# Patient Record
Sex: Female | Born: 1985 | Race: White | Hispanic: No | Marital: Married | Smoking: Never smoker
Health system: Southern US, Community
[De-identification: ages and names within clinical notes are randomized; demographics above are authoritative.]

## PROBLEM LIST (undated history)

## (undated) DIAGNOSIS — T8859XA Other complications of anesthesia, initial encounter: Secondary | ICD-10-CM

## (undated) DIAGNOSIS — F419 Anxiety disorder, unspecified: Secondary | ICD-10-CM

## (undated) DIAGNOSIS — T4145XA Adverse effect of unspecified anesthetic, initial encounter: Secondary | ICD-10-CM

## (undated) DIAGNOSIS — F32A Depression, unspecified: Secondary | ICD-10-CM

## (undated) DIAGNOSIS — F329 Major depressive disorder, single episode, unspecified: Secondary | ICD-10-CM

## (undated) DIAGNOSIS — Z789 Other specified health status: Secondary | ICD-10-CM

## (undated) HISTORY — PX: NO PAST SURGERIES: SHX2092

## (undated) HISTORY — PX: COMPARTMENT SYNDROME MEASUREMENT: CATH118296

---
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
## 2005-04-24 ENCOUNTER — Ambulatory Visit: Payer: Self-pay | Admitting: Orthopaedic Surgery

## 2013-03-13 ENCOUNTER — Emergency Department: Payer: Self-pay | Admitting: Emergency Medicine

## 2013-03-13 LAB — CBC
HGB: 13.6 g/dL (ref 12.0–16.0)
MCH: 30.1 pg (ref 26.0–34.0)
MCHC: 34.7 g/dL (ref 32.0–36.0)
MCV: 87 fL (ref 80–100)
Platelet: 171 10*3/uL (ref 150–440)
RBC: 4.51 10*6/uL (ref 3.80–5.20)
RDW: 12.1 % (ref 11.5–14.5)
WBC: 10.6 10*3/uL (ref 3.6–11.0)

## 2013-03-13 LAB — URINALYSIS, COMPLETE
Blood: NEGATIVE
Glucose,UR: NEGATIVE mg/dL (ref 0–75)
Ketone: NEGATIVE
Nitrite: NEGATIVE
Ph: 7 (ref 4.5–8.0)
Protein: NEGATIVE

## 2013-03-13 LAB — GC/CHLAMYDIA PROBE AMP

## 2013-03-13 LAB — COMPREHENSIVE METABOLIC PANEL
Albumin: 3.8 g/dL (ref 3.4–5.0)
Anion Gap: 4 — ABNORMAL LOW (ref 7–16)
Chloride: 108 mmol/L — ABNORMAL HIGH (ref 98–107)
Co2: 26 mmol/L (ref 21–32)
Creatinine: 1.14 mg/dL (ref 0.60–1.30)
EGFR (Non-African Amer.): >60
Glucose: 155 mg/dL — ABNORMAL HIGH (ref 65–99)
SGPT (ALT): 19 U/L (ref 12–78)
Total Protein: 7.4 g/dL (ref 6.4–8.2)

## 2013-03-13 LAB — HCG, QUANTITATIVE, PREGNANCY: Beta Hcg, Quant.: <1 m[IU]/mL — ABNORMAL LOW

## 2014-02-14 IMAGING — US US PELV - US TRANSVAGINAL
1 series · 14 of 25 positions shown · non-contrast
Comparison: none

REASON FOR EXAM: LOWER PELVIC PAIN ESP LLQ
COMMENTS:   May transport without cardiac monitor

[Series 1: us pelv - us transvaginal · 0.30mm/px · 14 of 52 slices shown]
[im 1/52]
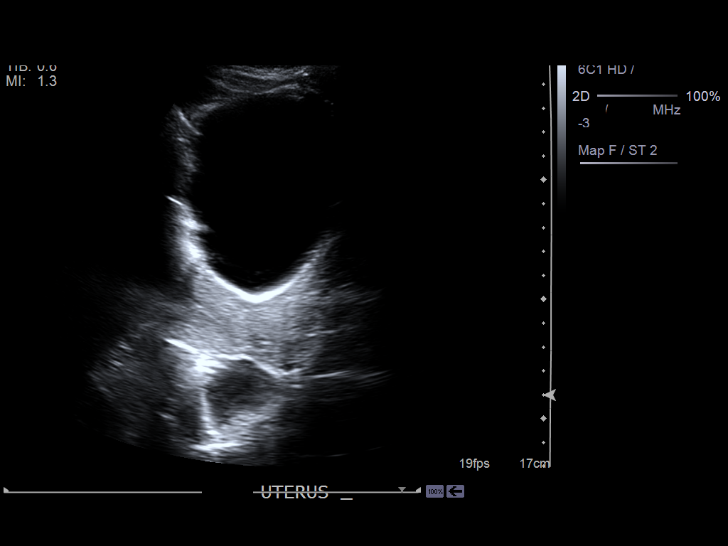
[im 5/52]
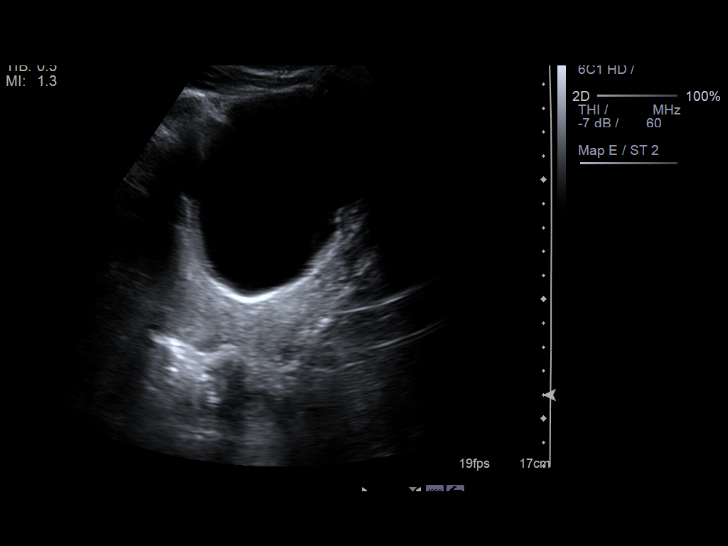
[im 9/52]
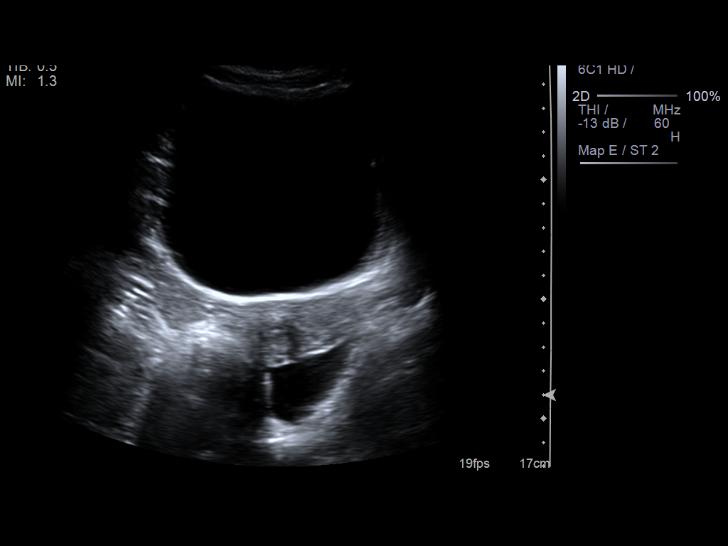
[im 13/52]
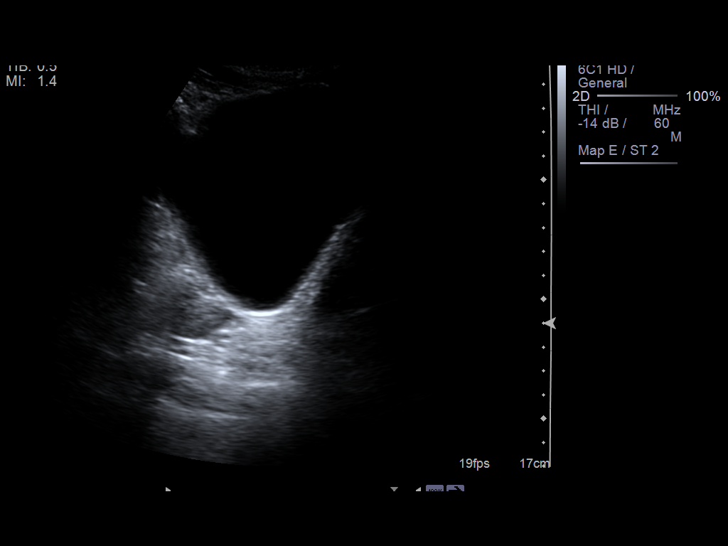
[im 18/52]
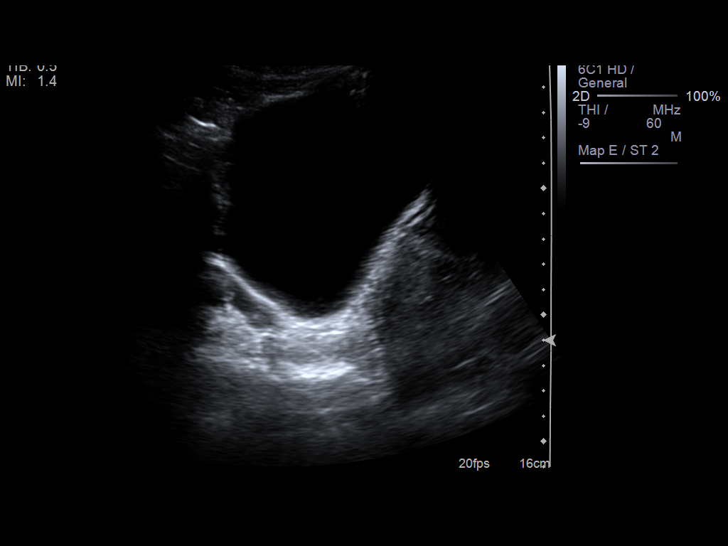
[im 20/52]
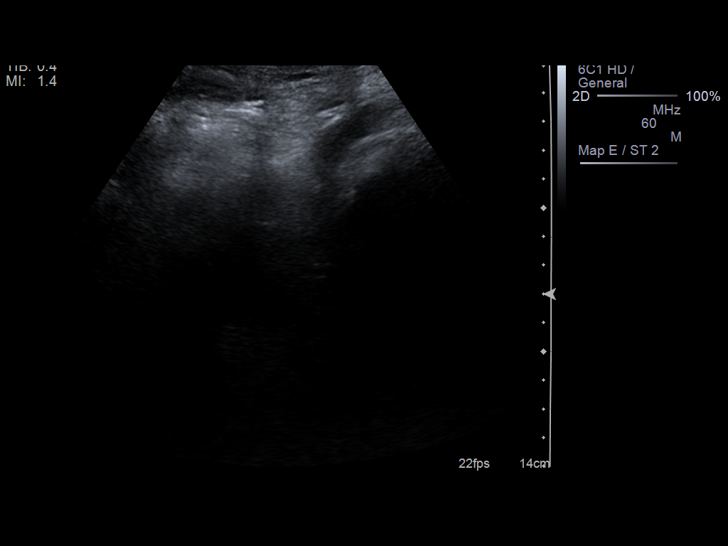
[im 24/52]
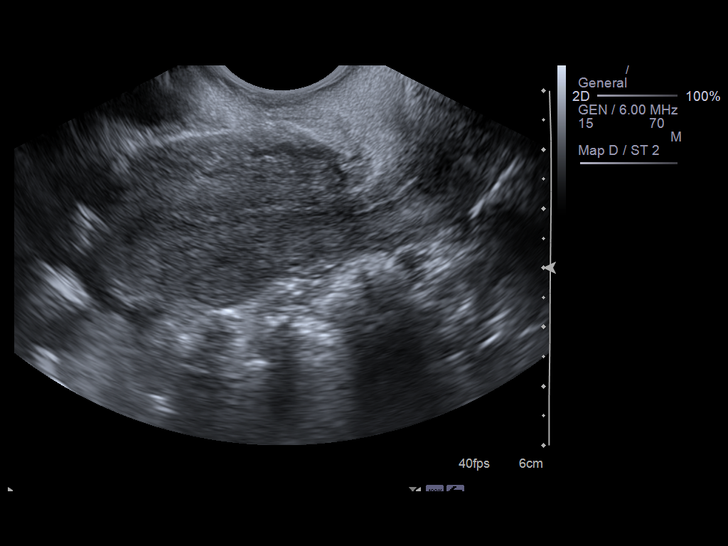
[im 28/52]
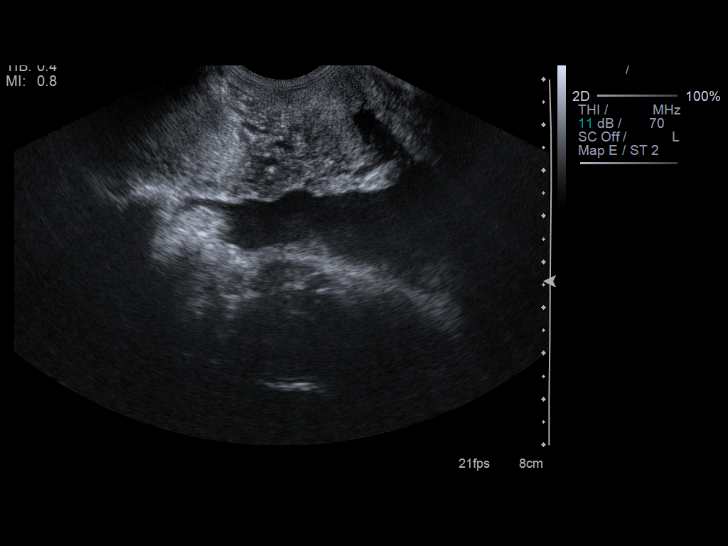
[im 32/52]
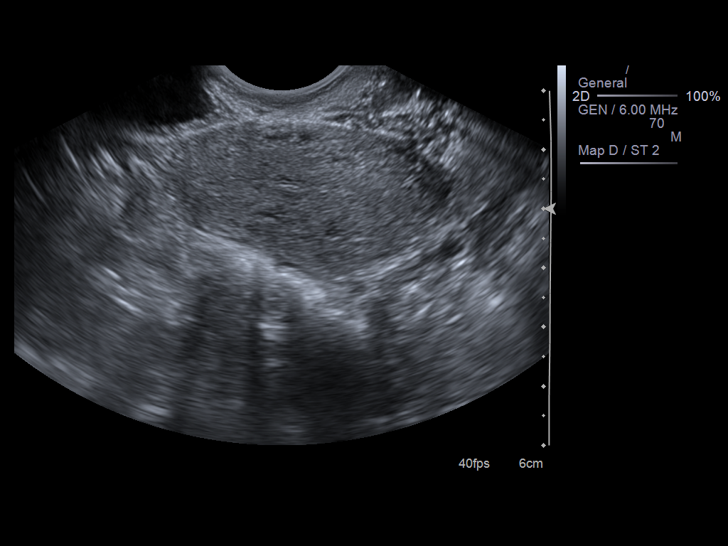
[im 35/52]
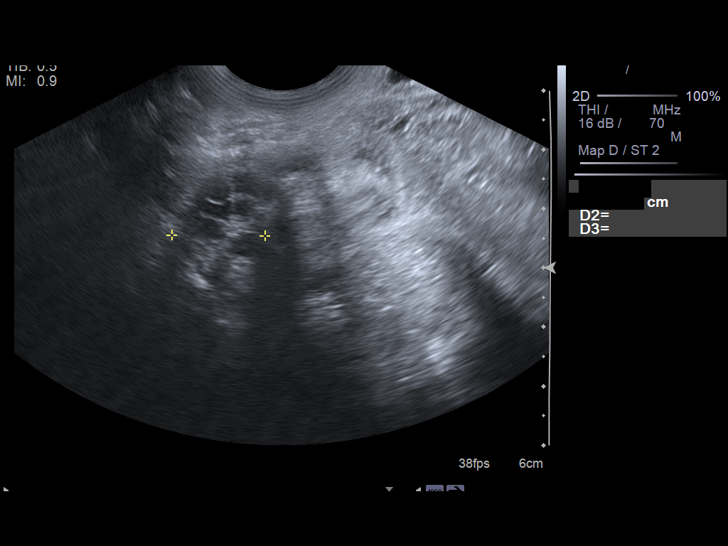
[im 39/52]
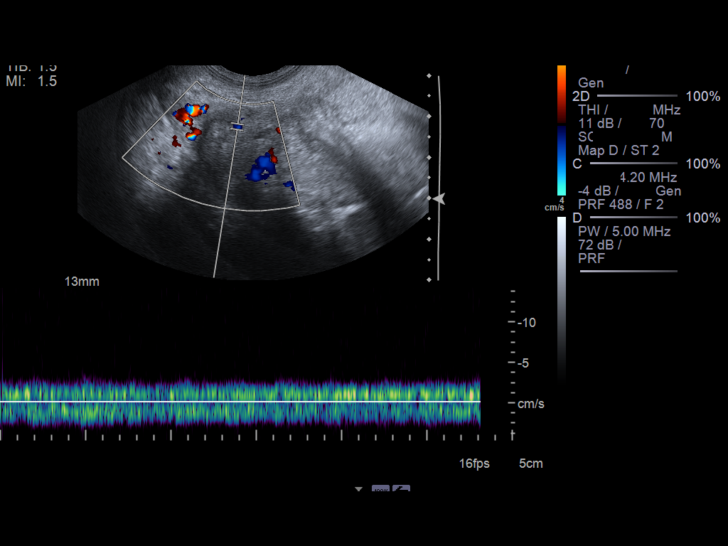
[im 43/52]
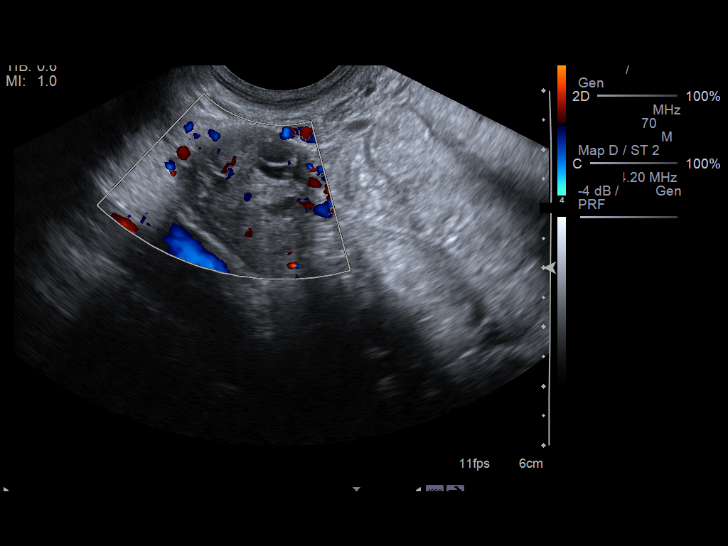
[im 47/52]
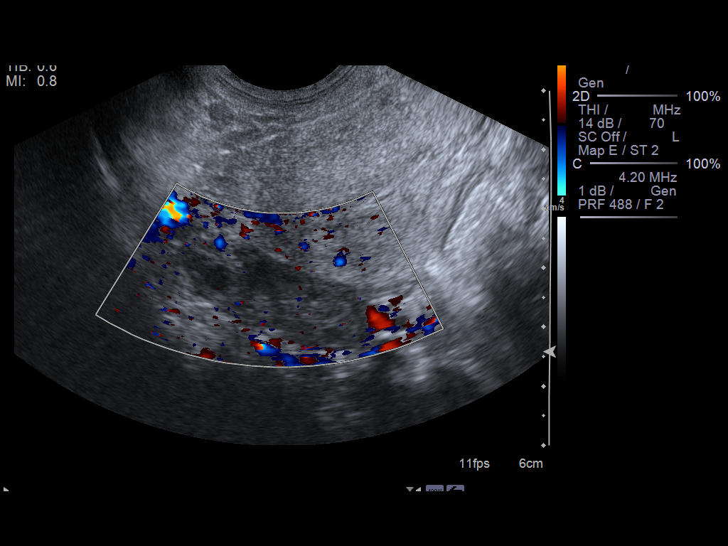
[im 52/52]
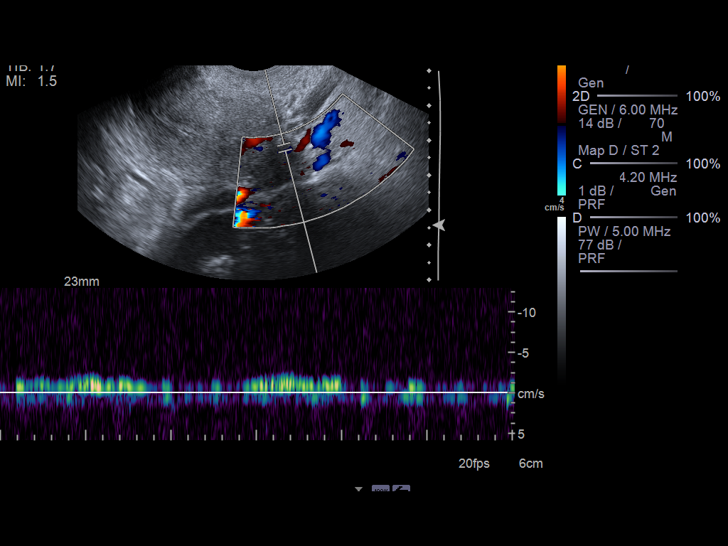

[14 of 25 positions shown; findings below may reference images not displayed]

PROCEDURE:     US  - US PELVIS EXAM W/TRANSVAGINAL  - March 13, 2013  [DATE]

RESULT:     Transabdominal and transvaginal imaging was performed through
the pelvis.

The uterus is normal in echotexture and contour and measures 6.1 x 4.3 x 3
cm. The endometrial stripe is normal at 2 mm. There is free fluid in the
cul-de-sac. The ovaries are normal in echotexture and contour and
vascularity. The right ovary measures 2.6 x 1.6 x 2.1 cm. The left ovary
measures 3.4 x 1.4 x 1.3 cm. Survey views of the kidneys exhibits no
hydronephrosis.
IMPRESSION: 1. The appearance of the uterus and ovaries is normal in appearance.
2. There is free fluid in the cul-de-sac.

A preliminary report was sent to the [HOSPITAL] the conclusion
of the study.

[REDACTED]

## 2014-02-14 IMAGING — CT CT ABD-PELV W/ CM
1 of 2 series · 14 of 32 positions shown, 18 images · non-contrast
Comparison: none

REASON FOR EXAM: (1) LOWER ABD PAIN; (2) LOWER ABD PAIN, EVAL
APPENDICITIS;    NOTE: Nursing to G
COMMENTS:   May transport without cardiac monitor

[Series 2: 3mm soft tissue · axial · 0.57mm/px · z∈[-1008,-610]mm · 14 of 153 slices shown, 18 images]
[im 13/153  soft-tissue]
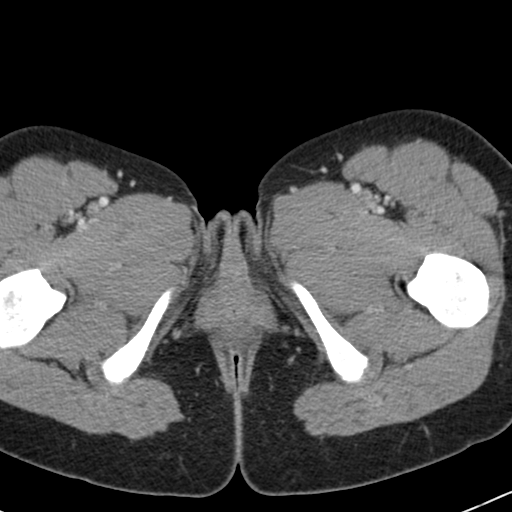
[im 13/153  bone]
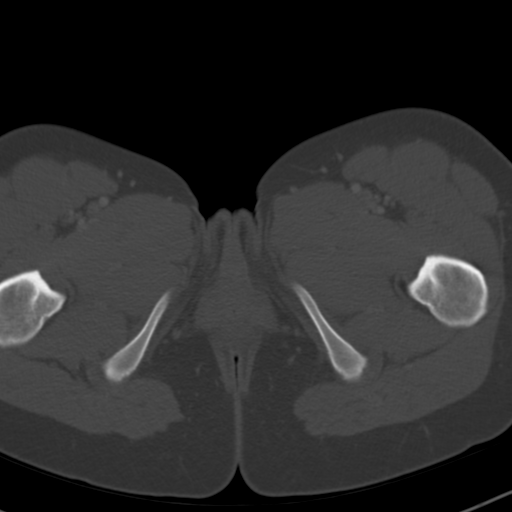
[im 25/153  soft-tissue]
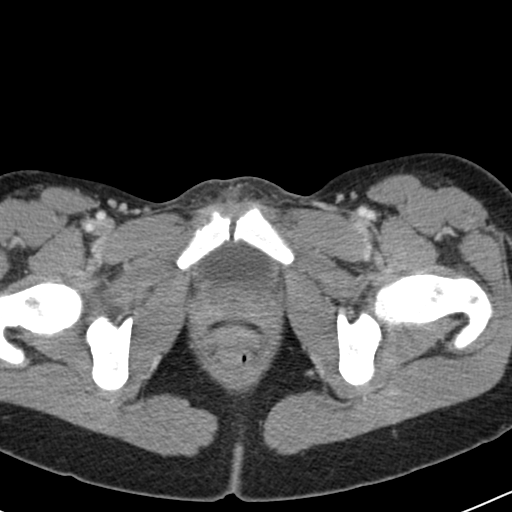
[im 37/153  soft-tissue]
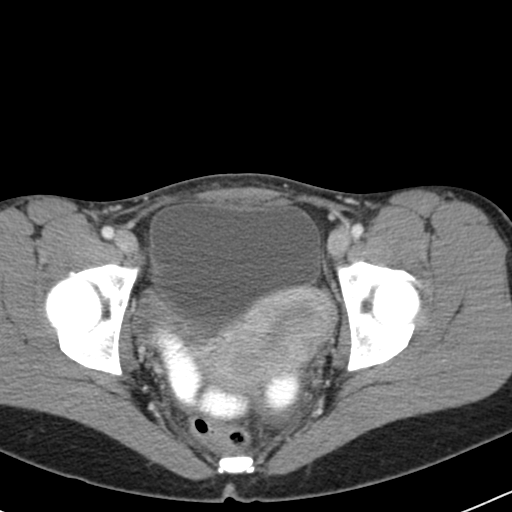
[im 49/153  soft-tissue]
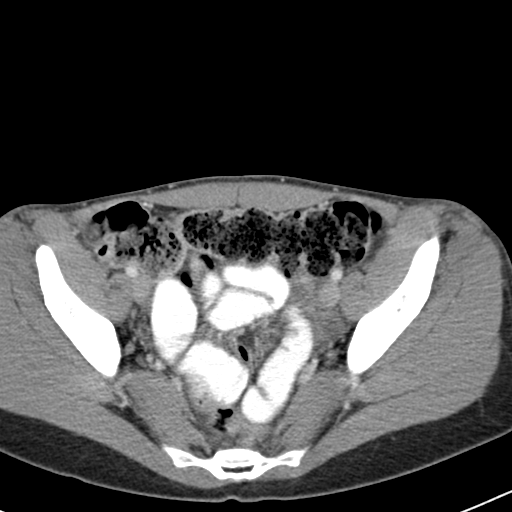
[im 61/153  soft-tissue]
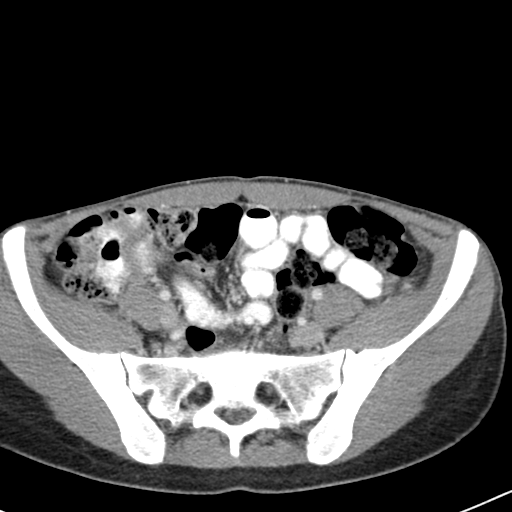
[im 73/153  soft-tissue]
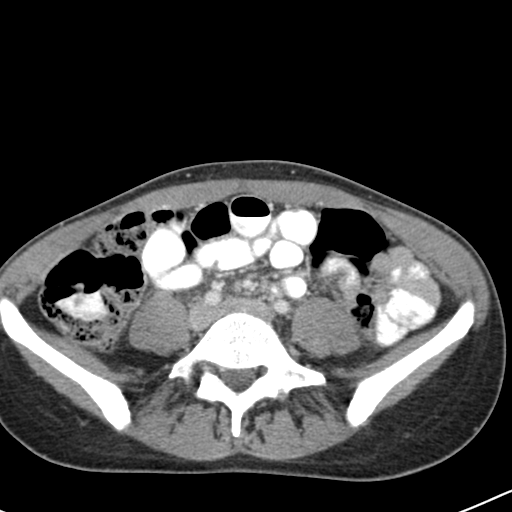
[im 86/153  soft-tissue]
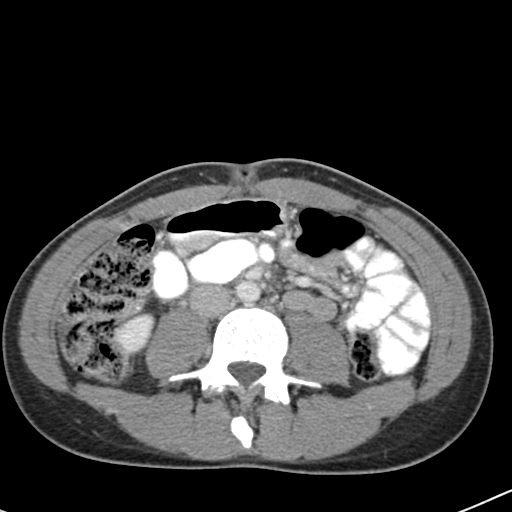
[im 98/153  soft-tissue]
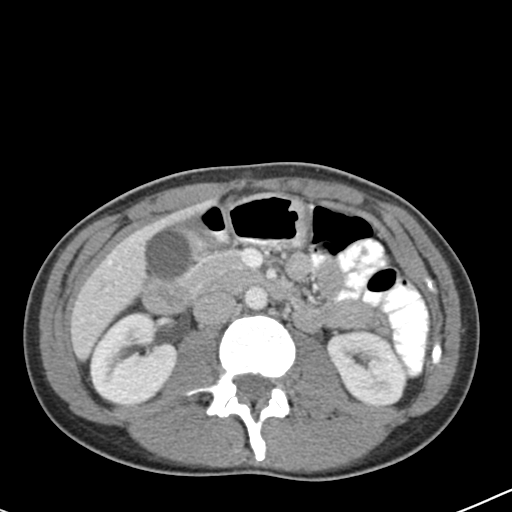
[im 110/153  soft-tissue]
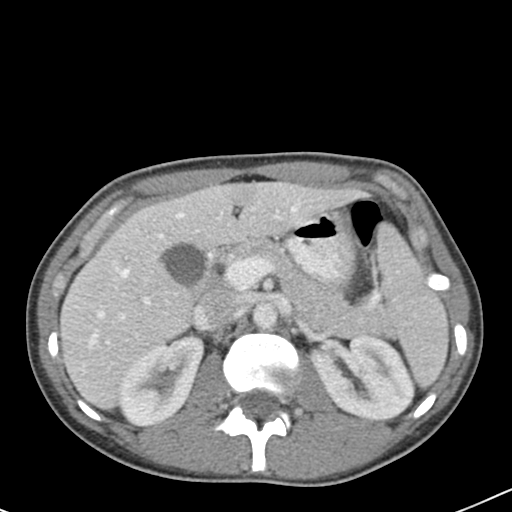
[im 110/153  bone]
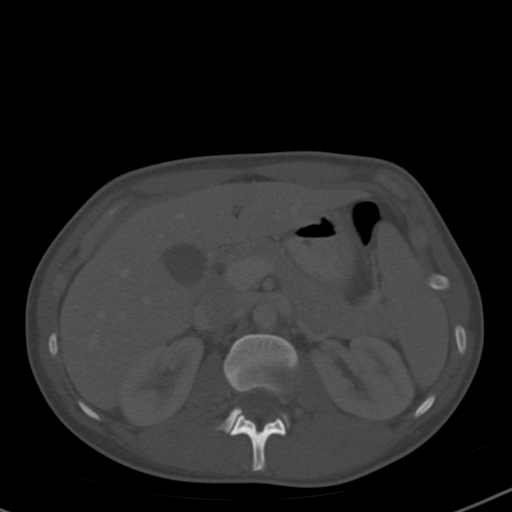
[im 122/153  soft-tissue]
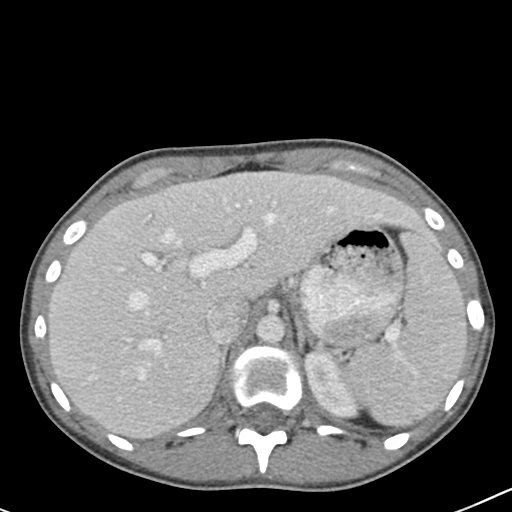
[im 128/153  lung]
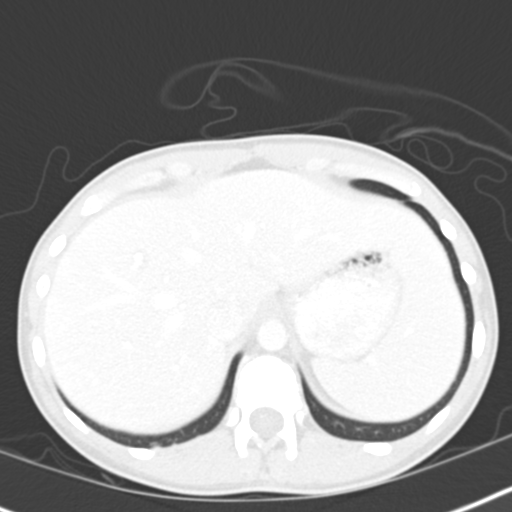
[im 134/153  soft-tissue]
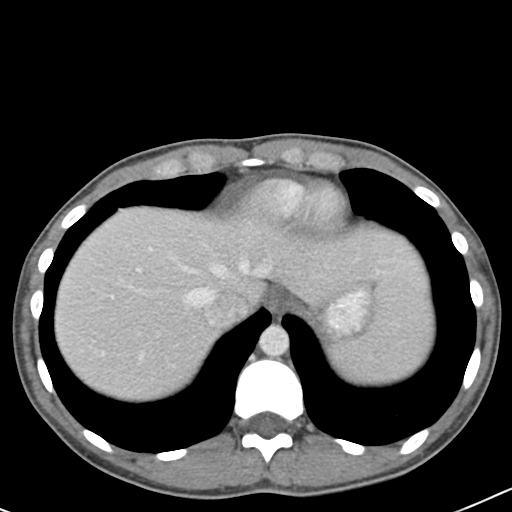
[im 134/153  lung]
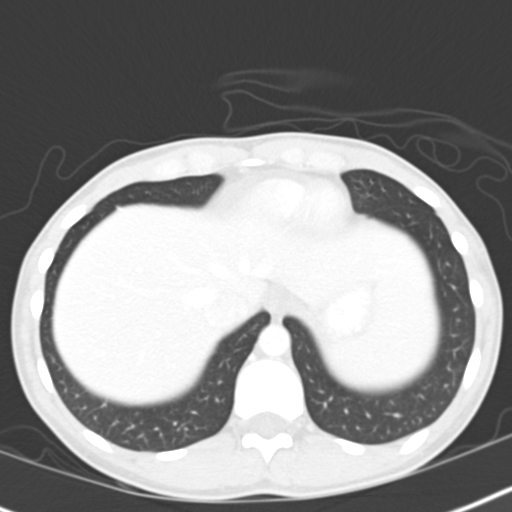
[im 140/153  lung]
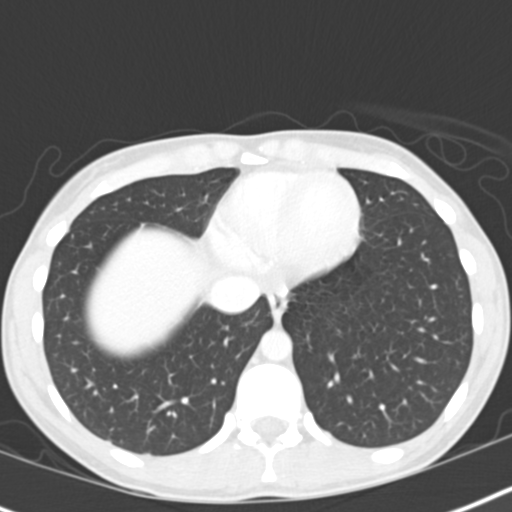
[im 146/153  soft-tissue]
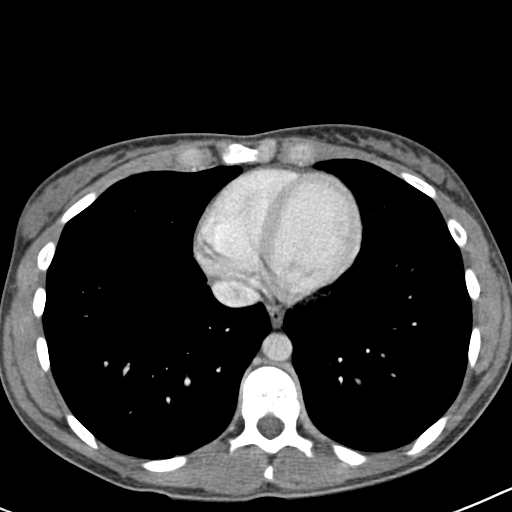
[im 146/153  lung]
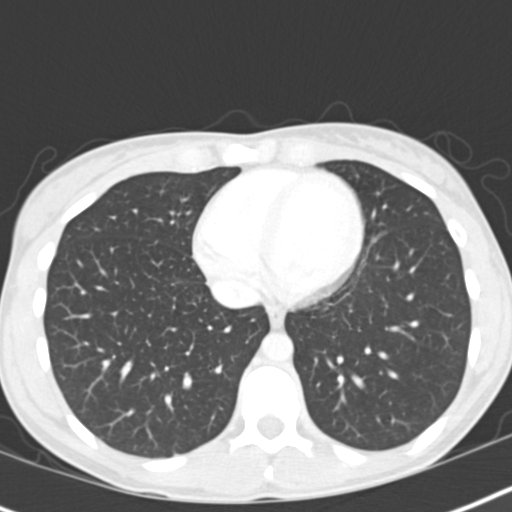

[14 of 32 positions shown; findings below may reference images not displayed]

PROCEDURE:     CT  - CT ABDOMEN / PELVIS  W  - March 13, 2013  [DATE]

RESULT:     Axial CT scanning was performed through the abdomen and pelvis
with reconstructions at 3 mm intervals and slice thicknesses. Review of
multiplanar reconstructed images was performed separately on the VIA
monitor. The patient received 100 cc of Rsovue-MEL intravenously and also
received oral contrast material.

The stomach is partially distended with contrast and gas. The small bowel
exhibits no evidence of obstruction. Contrast has just reached the right
colon. There is considerable stool present within the ascending and
transverse portions of the colon with somewhat less stool present in the
descending and rectosigmoid portions of the colon. There is no evidence of
colonic obstruction. There is no objective evidence of colitis or
diverticulitis. The features of the terminal ileum and of the cecum are
poorly separated from one another. There is no discrete abscess. The
appendix is not discretely identified. No free abdominal or pelvic fluid is
demonstrated.

The liver, gallbladder, pancreas, spleen , adrenal glands, and kidneys
exhibit no acute abnormalities. Within the pelvis the uterus is situated to
the left of midline and is grossly normal. The urinary bladder is partially
distended and normal in appearance. There is likely a diaphragm present in
the the vagina. No adnexal masses are demonstrated.

The lung bases exhibit no focal infiltrates or pleural effusions.
IMPRESSION: 1. There is no evidence of a small or large bowel obstruction. There is
considerable stool in the ascending and transverse portions of the colon
which may reflect constipation. There is no evidence of colitis or
diverticulitis. The appendix is not discretely identified but it may be
present anteriorly on images 92 through 99 and is visualized best on the
coronal reconstructed images. If the structure demonstrated here is the
appendix it is of normal caliber and is gas filled.
2. There is no free fluid in the abdomen or pelvis.
3. No acute hepatobiliary abnormality nor acute urinary tract abnormality is
demonstrated. The uterus is situated to the left of midline. There is likely
a diaphragm in place over the cervix.

It may be useful to observed the patient to see if the orally administered
contrast will stimulate colonic contraction and evacuation. Correlation with
patient's white blood cell count and other laboratory values will be needed
if the clinical findings do not suggest uncomplicated constipation.

[REDACTED]

## 2016-02-13 ENCOUNTER — Ambulatory Visit
Admission: EM | Admit: 2016-02-13 | Discharge: 2016-02-13 | Disposition: A | Discharge status: Home / Self Care | Payer: BC Managed Care – PPO | Attending: Family Medicine | Admitting: Family Medicine

## 2016-02-13 DIAGNOSIS — B029 Zoster without complications: Secondary | ICD-10-CM

## 2016-02-13 MED ORDER — VALACYCLOVIR HCL 1 G PO TABS: 1000.0000 mg | ORAL_TABLET | Freq: Three times a day (TID) | ORAL | 0 refills | Status: DC

## 2016-02-13 NOTE — ED Triage Notes (Addendum)
Patients presents with poison ivy on her face.

## 2016-03-30 NOTE — ED Provider Notes (Signed)
[facility]    CSN: [id number] Arrival date & time: 02/13/16  1525     History   Chief Complaint Chief Complaint  Patient presents with  . Poison Ivy    On face    HPI Alison Banks is a 30 y.o. female.   30 yo female with a c/o rash of blisters to forehead area that appeared suddenly. States feels burning pain with the rash. Denies any fevers, chills, contacts with plants, chemicals, pets.    The history is provided by the patient.  Poison Ivy     History reviewed. No pertinent past medical history.  There are no active problems to display for this patient.   History reviewed. No pertinent surgical history.  OB History    No data available       Home Medications    Prior to Admission medications   Medication Sig Start Date End Date Taking? Authorizing Provider  valACYclovir (VALTREX) 1000 MG tablet Take 1 tablet (1,000 mg total) by mouth 3 (three) times daily. 02/13/16   Payton Mccallumrlando Keymani Glynn, MD    Family History History reviewed. No pertinent family history.  Social History Social History  Substance Use Topics  . Smoking status: Never Smoker  . Smokeless tobacco: Never Used  . Alcohol use No     Allergies   Review of patient's allergies indicates no known allergies.   Review of Systems Review of Systems   Physical Exam Triage Vital Signs ED Triage Vitals  Enc Vitals Group     BP 02/13/16 1542 121/69     Pulse Rate 02/13/16 1542 72     Resp 02/13/16 1542 18     Temp 02/13/16 1542 98.2 F (36.8 C)     Temp Source 02/13/16 1542 Oral     SpO2 02/13/16 1542 100 %     Weight 02/13/16 1542 123 lb (55.8 kg)     Height 02/13/16 1542 5\' 5"  (1.651 m)     Head Circumference --      Peak Flow --      Pain Score 02/13/16 1540 0     Pain Loc --      Pain Edu? --      Excl. in GC? --    No data found.   Updated Vital Signs BP 121/69 (BP Location: Right Arm)   Pulse 72   Temp 98.2 F (36.8 C) (Oral)   Resp 18   Ht 5\' 5"  (1.651 m)    Wt 123 lb (55.8 kg)   LMP 01/30/2016   SpO2 100%   BMI 20.47 kg/m   Visual Acuity Right Eye Distance:   Left Eye Distance:   Bilateral Distance:    Right Eye Near:   Left Eye Near:    Bilateral Near:     Physical Exam  Constitutional: She appears well-developed and well-nourished. No distress.  Skin: Rash noted. Rash is vesicular. She is not diaphoretic. There is erythema.     Nursing note and vitals reviewed.    UC Treatments / Results  Labs (all labs ordered are listed, but only abnormal results are displayed) Labs Reviewed - No data to display  EKG  EKG Interpretation None       Radiology No results found.  Procedures Procedures (including critical care time)  Medications Ordered in UC Medications - No data to display   Initial Impression / Assessment and Plan / UC Course  I have reviewed the triage vital signs and the nursing notes.  Pertinent labs & imaging results that were available during my care of the patient were reviewed by me and considered in my medical decision making (see chart for details).  Clinical Course      Final Clinical Impressions(s) / UC Diagnoses   Final diagnoses:  Shingles    New Prescriptions Discharge Medication List as of 02/13/2016  4:04 PM    START taking these medications   Details  valACYclovir (VALTREX) 1000 MG tablet Take 1 tablet (1,000 mg total) by mouth 3 (three) times daily., Starting Wed 02/13/2016, Normal       1.diagnosis reviewed with patient 2. rx as per orders above; reviewed possible side effects, interactions, risks and benefits  3. Recommend supportive treatment with otc analgesics prn 4. Follow-up prn if symptoms worsen or don't improve   Payton Mccallum, MD 03/30/16 1818

## 2016-06-16 NOTE — L&D Delivery Note (Addendum)
Delivery Note At 3:11 AM a viable female was delivered via Vaginal, Spontaneous Delivery (Presentation: OA;  ).Pt progressed quickly and began to scream out. SROM occurred at [facility] for clear fluid with pt pushing due to complete and inability to control the urge to push. Nitrous Oxide being used to facilitate delivery. Vtx, ant and post shoulder and body followed completely and to mom's abd. No CAN. Delayed cord clamping done with CCx2 and cut per dad. Cord blood collected.   APGAR:8 ,9 ; weight  Placenta status: SDOP at 0319 intact, non-stained. 3 VC. , .  Cord:  with the following complications: None Cord pH: not done. Needlex x 2 and 10 sponges and 1 Lap pad correct. FF and lochia mod, VSS. Hemostasis achieved. 1% xylocaine for local to perineum. FF and massged to firm and EBL 100 ml's.   Anesthesia:  1%xylocaine Episiotomy: None Lacerations: 2nd degree Perineal Suture Repair: 3-0 CH on CT  Est. Blood Loss (mL): 100 ml's Mom to  Postpartum status.  Baby to skin to skin with mom.   Sharee Pimple 02/03/2017, 3:40 AM

## 2016-07-07 LAB — OB RESULTS CONSOLE HIV ANTIBODY (ROUTINE TESTING): HIV: NONREACTIVE

## 2016-07-07 LAB — OB RESULTS CONSOLE VARICELLA ZOSTER ANTIBODY, IGG: VARICELLA IGG: IMMUNE

## 2016-07-07 LAB — OB RESULTS CONSOLE RUBELLA ANTIBODY, IGM: RUBELLA: IMMUNE

## 2016-07-07 LAB — OB RESULTS CONSOLE HEPATITIS B SURFACE ANTIGEN: Hepatitis B Surface Ag: NEGATIVE

## 2016-07-07 LAB — OB RESULTS CONSOLE RPR: RPR: NONREACTIVE

## 2016-07-08 ENCOUNTER — Other Ambulatory Visit: Payer: Self-pay | Admitting: Obstetrics and Gynecology

## 2016-07-08 DIAGNOSIS — Z369 Encounter for antenatal screening, unspecified: Secondary | ICD-10-CM

## 2016-07-24 ENCOUNTER — Telehealth: Payer: Self-pay | Admitting: Obstetrics and Gynecology

## 2016-07-24 NOTE — Telephone Encounter (Signed)
Alison Banks is scheduled for first trimester screening at [facility] [place] on Monday, 07/28/2016.  We typically include genetic counseling prior to the screening test to ensure informed consent prior to testing.  I will be at an offsite meeting on the day of Alison Banks's appointment, so I called her to review the testing options prior to her visit.  At the time of her visit, a [facility] physician will be present and available to answer any additional questions.  I plan to follow up with her after the visit when results become available.  We discussed the following routine screening tests for this pregnancy:  First trimester screening, which includes nuchal translucency ultrasound screen and first trimester maternal serum marker screening.  The nuchal translucency has approximately an 80% detection rate for Down syndrome and can be positive for other chromosome abnormalities as well as congenital heart defects.  When combined with a maternal serum marker screening, the detection rate is up to 90% for Down syndrome and up to 97% for trisomy 18.     Maternal serum marker screening, a blood test that measures pregnancy proteins, can provide risk assessments for Down syndrome, trisomy 18, and open neural tube defects (spina bifida, anencephaly). Because it does not directly examine the fetus, it cannot positively diagnose or rule out these problems.  Targeted ultrasound uses high frequency sound waves to create an image of the developing fetus.  An ultrasound is often recommended as a routine means of evaluating the pregnancy.  It is also used to screen for fetal anatomy problems (for example, a heart defect) that might be suggestive of a chromosomal or other abnormality.   Should these screening tests indicate an increased concern, then the following additional testing options would be offered:  The chorionic villus sampling procedure is available for first trimester chromosome analysis.   This involves the withdrawal of a small amount of chorionic villi (tissue from the developing placenta).  Risk of pregnancy loss is estimated to be approximately 1 in 200 to 1 in 100 (0.5 to 1%).  There is approximately a 1% (1 in 100) chance that the CVS chromosome results will be unclear.  Chorionic villi cannot be tested for neural tube defects.     Amniocentesis involves the removal of a small amount of amniotic fluid from the sac surrounding the fetus with the use of a thin needle inserted through the maternal abdomen and uterus.  Ultrasound guidance is used throughout the procedure.  Fetal cells from amniotic fluid are directly evaluated and > 99.5% of chromosome problems and > 98% of open neural tube defects can be detected. This procedure is generally performed after the 15th week of pregnancy.  The main risks to this procedure include complications leading to miscarriage in less than 1 in 200 cases (0.5%).  As another option for information if the pregnancy is suspected to be an an increased chance for certain chromosome conditions, we also reviewed the availability of cell free fetal DNA testing from maternal blood to determine whether or not the baby may have either Down syndrome, trisomy 14, or trisomy 52.  This test utilizes a maternal blood sample and DNA sequencing technology to isolate circulating cell free fetal DNA from maternal plasma.  The fetal DNA can then be analyzed for DNA sequences that are derived from the three most common chromosomes involved in aneuploidy, chromosomes 13, 18, and 21.  If the overall amount of DNA is greater than the expected level for any  of these chromosomes, aneuploidy is suspected.  While we do not consider it a replacement for invasive testing and karyotype analysis, a negative result from this testing would be reassuring, though not a guarantee of a normal chromosome complement for the baby.  An abnormal result is certainly suggestive of an abnormal chromosome  complement, though we would still recommend CVS or amniocentesis to confirm any findings from this testing.  Cystic Fibrosis and Spinal Muscular Atrophy (SMA) screening were also discussed with the patient. Both conditions are recessive, which means that both parents must be carriers in order to have a child with the disease.  Cystic fibrosis (CF) is one of the most common genetic conditions in persons of Caucasian ancestry.  This condition occurs in approximately 1 in 2,500 Caucasian persons and results in thickened secretions in the lungs, digestive, and reproductive systems.  For a baby to be at risk for having CF, both of the parents must be carriers for this condition.  Approximately 1 in 425 Caucasian persons is a carrier for CF.  Current carrier testing looks for the most common mutations in the gene for CF and can detect approximately 90% of carriers in the Caucasian population.  This means that the carrier screening can greatly reduce, but cannot eliminate, the chance for an individual to have a child with CF.  If an individual is found to be a carrier for CF, then carrier testing would be available for the partner. As part of [place]'s newborn screening profile, all babies born in the state of [place] will have a two-tier screening process.  Specimens are first tested to determine the concentration of immunoreactive trypsinogen (IRT).  The top 5% of specimens with the highest IRT values then undergo DNA testing using a panel of over 40 common CF mutations. SMA is a neurodegenerative disorder that leads to atrophy of skeletal muscle and overall weakness.  This condition is also more prevalent in the Caucasian population, with 1 in 40-1 in 60 persons being a carrier and 1 in 6,000-1 in 10,000 children being affected.  There are multiple forms of the disease, with some causing death in infancy to other forms with survival into adulthood.  The genetics of SMA is complex, but carrier screening  can detect up to 95% of carriers in the Caucasian population.  Similar to CF, a negative result can greatly reduce, but cannot eliminate, the chance to have a child with SMA.  We inquired about the family history and pregnancy history.  Alison Banks reported that the father of the baby had a paternal aunt who passed away in early childhood (in the 311950s) from complications of spina bifida.  There is also a paternal second cousin to the pregnancy with spina bifida.  We reviewed multifactorial inheritance of open neural tube defects when they are present in the absence of other birth defects or genetic syndromes.  The recurrence when a second degree relative or more distant relative is affected is estimated to be less than 1%, though it may be slightly higher in this family given the presence of two affected family members.  We reviewed the option of screening for open neural tube defects with msAFP testing in the second trimester and detailed anatomy ultrasound after [redacted] weeks gestation. The remainder of the family history was reported to be unremarkable for birth defects, mental retardation, recurrent pregnancy loss or known chromosome abnormalities.  Alison Banks reported no complications or exposures in this pregnancy that would be expected to increase  the risk for birth defects.  Alison Banks declined CF and SMA carrier screening, but would like to proceed with first trimester screening at her visit on 07/28/2016.  Alison Banks was encouraged to call with questions or concerns.  We can be contacted at [phone].  Cherly Anderson, MS, CGC

## 2016-07-28 ENCOUNTER — Ambulatory Visit: Admission: RE | Admit: 2016-07-28 | Payer: BC Managed Care – PPO | Source: Ambulatory Visit

## 2016-07-28 ENCOUNTER — Ambulatory Visit
Admission: RE | Admit: 2016-07-28 | Discharge: 2016-07-28 | Disposition: A | Discharge status: Home / Self Care | Payer: BC Managed Care – PPO | Source: Ambulatory Visit | Attending: Maternal & Fetal Medicine | Admitting: Maternal & Fetal Medicine

## 2016-07-28 DIAGNOSIS — Z3401 Encounter for supervision of normal first pregnancy, first trimester: Secondary | ICD-10-CM | POA: Insufficient documentation

## 2016-07-28 DIAGNOSIS — Z3400 Encounter for supervision of normal first pregnancy, unspecified trimester: Secondary | ICD-10-CM | POA: Diagnosis present

## 2016-07-28 DIAGNOSIS — Z369 Encounter for antenatal screening, unspecified: Secondary | ICD-10-CM

## 2016-07-28 DIAGNOSIS — Z3A12 12 weeks gestation of pregnancy: Secondary | ICD-10-CM | POA: Insufficient documentation

## 2016-07-28 HISTORY — DX: Other specified health status: Z78.9

## 2016-08-04 ENCOUNTER — Telehealth: Payer: Self-pay | Admitting: Obstetrics and Gynecology

## 2016-08-04 ENCOUNTER — Telehealth: Payer: Self-pay

## 2016-08-04 NOTE — Telephone Encounter (Signed)
  Ms. Alison Banks elected to undergo First Trimester screening as a part of her routine prenatal care on 07/28/2016.  To review, first trimester screening, includes nuchal translucency ultrasound screen and/or first trimester maternal serum marker screening.  The nuchal translucency has approximately an 80% detection rate for Down syndrome and can be positive for other chromosome abnormalities as well as heart defects.  When combined with a maternal serum marker screening, the detection rate is up to 90% for Down syndrome and up to 97% for trisomy 13 and 18.     The results of the First Trimester Nuchal Translucency and Biochemical Screening were within normal range for aneuploidy.  The risk for Down syndrome is now estimated to be 1 in 3,092.  The risk for Trisomy 13/18 is 1 in 2,673.  Should more definitive information be desired, we would offer amniocentesis.  Because we do not yet know the effectiveness of combined first and second trimester screening, we do not recommend a maternal serum screen to assess the chance for chromosome conditions.  However, if screening for neural tube defects is desired, maternal serum screening for AFP only can be performed between 15 and [redacted] weeks gestation.    Of note, the PAPP-A level was at the 2nd percentile.  PAPP-A results at or below the 5th percentile have been associated with and increased chance for adverse obstetrical outcomes, including preeclampsia.  For this reason, a third trimester ultrasound for growth is recommended.  We may be reached at [phone] with any questions or to schedule the follow up ultrasound.  Cherly Andersoneborah F. Gloristine Turrubiates, MS, CGC

## 2017-01-14 LAB — OB RESULTS CONSOLE GBS: GBS: POSITIVE

## 2017-01-14 LAB — OB RESULTS CONSOLE GC/CHLAMYDIA
Chlamydia: NEGATIVE
Gonorrhea: NEGATIVE

## 2017-01-23 ENCOUNTER — Other Ambulatory Visit: Payer: Self-pay | Admitting: Obstetrics and Gynecology

## 2017-02-02 ENCOUNTER — Inpatient Hospital Stay
Admission: EM | Admit: 2017-02-02 | Discharge: 2017-02-05 | DRG: 775 | Disposition: A | Discharge status: Home / Self Care | Payer: BC Managed Care – PPO | Attending: Obstetrics and Gynecology | Admitting: Obstetrics and Gynecology

## 2017-02-02 DIAGNOSIS — O9902 Anemia complicating childbirth: Secondary | ICD-10-CM | POA: Diagnosis present

## 2017-02-02 DIAGNOSIS — O351XX0 Maternal care for (suspected) chromosomal abnormality in fetus, not applicable or unspecified: Secondary | ICD-10-CM | POA: Diagnosis present

## 2017-02-02 DIAGNOSIS — Z3A39 39 weeks gestation of pregnancy: Secondary | ICD-10-CM

## 2017-02-02 DIAGNOSIS — D649 Anemia, unspecified: Secondary | ICD-10-CM | POA: Diagnosis present

## 2017-02-02 DIAGNOSIS — Z3490 Encounter for supervision of normal pregnancy, unspecified, unspecified trimester: Secondary | ICD-10-CM | POA: Diagnosis present

## 2017-02-02 LAB — CBC
HEMATOCRIT: 36.6 % (ref 35.0–47.0)
HEMOGLOBIN: 12.7 g/dL (ref 12.0–16.0)
MCH: 30.5 pg (ref 26.0–34.0)
MCHC: 34.8 g/dL (ref 32.0–36.0)
MCV: 87.5 fL (ref 80.0–100.0)
Platelets: 187 10*3/uL (ref 150–440)
RBC: 4.18 MIL/uL (ref 3.80–5.20)
RDW: 14 % (ref 11.5–14.5)
WBC: 11.4 10*3/uL — ABNORMAL HIGH (ref 3.6–11.0)

## 2017-02-02 LAB — TYPE AND SCREEN
ABO/RH(D): B POS
Antibody Screen: NEGATIVE

## 2017-02-02 MED ORDER — ACETAMINOPHEN 325 MG PO TABS: 650.0000 mg | ORAL_TABLET | ORAL | Status: DC | PRN

## 2017-02-02 MED ORDER — LACTATED RINGERS IV SOLN
INTRAVENOUS | Status: DC
  Administered 2017-02-02: 21:00:00 via INTRAVENOUS

## 2017-02-02 MED ORDER — OXYCODONE-ACETAMINOPHEN 5-325 MG PO TABS: 2.0000 | ORAL_TABLET | ORAL | Status: DC | PRN

## 2017-02-02 MED ORDER — MISOPROSTOL 200 MCG PO TABS
ORAL_TABLET | ORAL | Status: AC
  Administered 2017-02-02: 25 ug via VAGINAL
  Filled 2017-02-02: qty 4

## 2017-02-02 MED ORDER — OXYTOCIN 10 UNIT/ML IJ SOLN
INTRAMUSCULAR | Status: AC
  Filled 2017-02-02: qty 2

## 2017-02-02 MED ORDER — LIDOCAINE HCL (PF) 1 % IJ SOLN
30.0000 mL | INTRAMUSCULAR | Status: DC | PRN
  Administered 2017-02-03: 30 mL via SUBCUTANEOUS

## 2017-02-02 MED ORDER — PENICILLIN G POT IN DEXTROSE 60000 UNIT/ML IV SOLN
3.0000 10*6.[IU] | INTRAVENOUS | Status: DC
  Filled 2017-02-02 (×4): qty 50

## 2017-02-02 MED ORDER — LACTATED RINGERS IV SOLN: 500.0000 mL | INTRAVENOUS | Status: DC | PRN

## 2017-02-02 MED ORDER — OXYTOCIN BOLUS FROM INFUSION
500.0000 mL | Freq: Once | INTRAVENOUS | Status: AC
  Administered 2017-02-03: 500 mL via INTRAVENOUS

## 2017-02-02 MED ORDER — AMMONIA AROMATIC IN INHA
RESPIRATORY_TRACT | Status: AC
  Filled 2017-02-02: qty 10

## 2017-02-02 MED ORDER — LIDOCAINE HCL (PF) 1 % IJ SOLN
INTRAMUSCULAR | Status: AC
  Administered 2017-02-03: 30 mL via SUBCUTANEOUS
  Filled 2017-02-02: qty 30

## 2017-02-02 MED ORDER — BUTORPHANOL TARTRATE 1 MG/ML IJ SOLN
1.0000 mg | INTRAMUSCULAR | Status: DC | PRN
  Administered 2017-02-03: 1 mg via INTRAVENOUS
  Filled 2017-02-02: qty 1

## 2017-02-02 MED ORDER — SOD CITRATE-CITRIC ACID 500-334 MG/5ML PO SOLN: 30.0000 mL | ORAL | Status: DC | PRN

## 2017-02-02 MED ORDER — MISOPROSTOL 25 MCG QUARTER TABLET
25.0000 ug | ORAL_TABLET | ORAL | Status: DC
  Administered 2017-02-02: 25 ug via VAGINAL
  Filled 2017-02-02: qty 1

## 2017-02-02 MED ORDER — OXYTOCIN 40 UNITS IN LACTATED RINGERS INFUSION - SIMPLE MED
2.5000 [IU]/h | INTRAVENOUS | Status: DC
  Filled 2017-02-02: qty 1000

## 2017-02-02 MED ORDER — DEXTROSE 5 % IV SOLN
5.0000 10*6.[IU] | Freq: Once | INTRAVENOUS | Status: AC
  Administered 2017-02-03: 5 10*6.[IU] via INTRAVENOUS
  Filled 2017-02-02: qty 5

## 2017-02-02 MED ORDER — OXYCODONE-ACETAMINOPHEN 5-325 MG PO TABS: 1.0000 | ORAL_TABLET | ORAL | Status: DC | PRN

## 2017-02-02 MED ORDER — ONDANSETRON HCL 4 MG/2ML IJ SOLN: 4.0000 mg | Freq: Four times a day (QID) | INTRAMUSCULAR | Status: DC | PRN

## 2017-02-02 NOTE — H&P (Signed)
Santosha Burness is a 31 y.o. female presenting for IOL for low Papp-A. PNC at [facility] significant for high risk for IUGR, PIH & and stillbirth. IOL scheduled for these reasons. AFI borderline low. Pt also developed anemia tx with Fe. Plan to induce at 39 weeks.  OB History    Gravida Para Term Preterm AB Living   1         0   SAB TAB Ectopic Multiple Live Births                 Past Medical History:  Diagnosis Date  . Medical history non-contributory    History reviewed. No pertinent surgical history. Family History: family history is not on file. Social History:  reports that she has never smoked. She has never used smokeless tobacco. She reports that she does not drink alcohol or use drugs.     Maternal Diabetes: 45 Genetic Screening: AFP neg Maternal Ultrasounds/Referrals:01/26/17 AFI 11.98 cm @40 % Fetal Ultrasounds or other Referrals:[facility] anatomy  Maternal Substance Abuse: None Significant Maternal Medications:  PNV, Fe Significant Maternal Lab Results: Low PAPP-A Other Comments:    Review of Systems  Constitutional: Negative.   HENT: Negative.   Eyes: Negative.   Respiratory: Negative.   Cardiovascular: Negative.   Gastrointestinal: Negative.   Genitourinary: Negative.   Musculoskeletal: Negative.   Skin: Negative.   Neurological: Negative.   Endo/Heme/Allergies: Negative.   Psychiatric/Behavioral: Negative.    History Dilation: 1 Effacement (%): 50 Station: -2 Exam by:: ALogan Bores, RN Blood pressure 131/89, pulse 86, temperature 97.7 F (36.5 C), temperature source Oral, resp. rate 18, height 5\' 5"  (1.651 m), weight 70.8 kg (156 lb), last menstrual period 05/05/2015. Exam Physical Exam  Prenatal labs: ABO, Rh: B pos Antibody: Neg  Rubella: Immune (01/22 0000) RPR: Nonreactive (01/22 0000)  HBsAg: Negative (01/22 0000)  HIV: Non-reactive (01/22 0000)  GBS: Positive (08/01 0000)  Varicella:IMMUNE Assessment/Plan: A:1. IUP at 39 weeks 2. High risk due to low  Papp-a putting pt at risk for PIH, stillborn and IUGR P: Induction scheduled by Dr Karleen Hampshire and pt aware and agrees with the plan of care. 2 Cytoec 25 mcg per vagina as ordered. 3. Continue to monitor UC/FHT's.    Sharee Pimple 02/02/2017, 9:29 PM

## 2017-02-03 LAB — CBC
HEMATOCRIT: 37.6 % (ref 35.0–47.0)
HEMOGLOBIN: 13.1 g/dL (ref 12.0–16.0)
MCH: 30.8 pg (ref 26.0–34.0)
MCHC: 34.8 g/dL (ref 32.0–36.0)
MCV: 88.5 fL (ref 80.0–100.0)
Platelets: 188 10*3/uL (ref 150–440)
RBC: 4.25 MIL/uL (ref 3.80–5.20)
RDW: 14 % (ref 11.5–14.5)
WBC: 22.6 10*3/uL — ABNORMAL HIGH (ref 3.6–11.0)

## 2017-02-03 MED ORDER — SIMETHICONE 80 MG PO CHEW: 80.0000 mg | CHEWABLE_TABLET | ORAL | Status: DC | PRN

## 2017-02-03 MED ORDER — DIBUCAINE 1 % RE OINT: 1.0000 "application " | TOPICAL_OINTMENT | RECTAL | Status: DC | PRN

## 2017-02-03 MED ORDER — ZOLPIDEM TARTRATE 5 MG PO TABS: 5.0000 mg | ORAL_TABLET | Freq: Once | ORAL | Status: DC

## 2017-02-03 MED ORDER — SENNOSIDES-DOCUSATE SODIUM 8.6-50 MG PO TABS
2.0000 | ORAL_TABLET | ORAL | Status: DC
  Administered 2017-02-04 – 2017-02-05 (×2): 2 via ORAL
  Filled 2017-02-03 (×2): qty 2

## 2017-02-03 MED ORDER — TETANUS-DIPHTH-ACELL PERTUSSIS 5-2.5-18.5 LF-MCG/0.5 IM SUSP: 0.5000 mL | Freq: Once | INTRAMUSCULAR | Status: DC

## 2017-02-03 MED ORDER — COCONUT OIL OIL: 1.0000 "application " | TOPICAL_OIL | Status: DC | PRN

## 2017-02-03 MED ORDER — PRENATAL MULTIVITAMIN CH
1.0000 | ORAL_TABLET | Freq: Every day | ORAL | Status: DC
  Administered 2017-02-03 – 2017-02-05 (×3): 1 via ORAL
  Filled 2017-02-03 (×3): qty 1

## 2017-02-03 MED ORDER — ZOLPIDEM TARTRATE 5 MG PO TABS: 5.0000 mg | ORAL_TABLET | Freq: Every evening | ORAL | Status: DC | PRN

## 2017-02-03 MED ORDER — ACETAMINOPHEN 325 MG PO TABS
650.0000 mg | ORAL_TABLET | ORAL | Status: DC | PRN
Start: 2017-02-03 — End: 2017-02-05

## 2017-02-03 MED ORDER — ONDANSETRON HCL 4 MG PO TABS: 4.0000 mg | ORAL_TABLET | ORAL | Status: DC | PRN

## 2017-02-03 MED ORDER — DIPHENHYDRAMINE HCL 25 MG PO CAPS: 25.0000 mg | ORAL_CAPSULE | Freq: Four times a day (QID) | ORAL | Status: DC | PRN

## 2017-02-03 MED ORDER — SODIUM CHLORIDE 0.9% FLUSH: 3.0000 mL | INTRAVENOUS | Status: DC | PRN

## 2017-02-03 MED ORDER — WITCH HAZEL-GLYCERIN EX PADS: 1.0000 "application " | MEDICATED_PAD | CUTANEOUS | Status: DC | PRN

## 2017-02-03 MED ORDER — MEASLES, MUMPS & RUBELLA VAC ~~LOC~~ INJ: 0.5000 mL | INJECTION | Freq: Once | SUBCUTANEOUS | Status: DC

## 2017-02-03 MED ORDER — SODIUM CHLORIDE 0.9% FLUSH: 3.0000 mL | Freq: Two times a day (BID) | INTRAVENOUS | Status: DC

## 2017-02-03 MED ORDER — ONDANSETRON HCL 4 MG/2ML IJ SOLN: 4.0000 mg | INTRAMUSCULAR | Status: DC | PRN

## 2017-02-03 MED ORDER — IBUPROFEN 600 MG PO TABS
600.0000 mg | ORAL_TABLET | Freq: Four times a day (QID) | ORAL | Status: DC
  Administered 2017-02-03 – 2017-02-05 (×10): 600 mg via ORAL
  Filled 2017-02-03 (×9): qty 1

## 2017-02-03 MED ORDER — BENZOCAINE-MENTHOL 20-0.5 % EX AERO: 1.0000 "application " | INHALATION_SPRAY | CUTANEOUS | Status: DC | PRN

## 2017-02-03 MED ORDER — FLEET ENEMA 7-19 GM/118ML RE ENEM: 1.0000 | ENEMA | Freq: Every day | RECTAL | Status: DC | PRN

## 2017-02-03 MED ORDER — BISACODYL 10 MG RE SUPP: 10.0000 mg | Freq: Every day | RECTAL | Status: DC | PRN

## 2017-02-03 MED ORDER — IBUPROFEN 600 MG PO TABS
ORAL_TABLET | ORAL | Status: AC
  Administered 2017-02-03: 600 mg via ORAL
  Filled 2017-02-03: qty 1

## 2017-02-03 MED ORDER — SODIUM CHLORIDE 0.9 % IV SOLN: 250.0000 mL | INTRAVENOUS | Status: DC | PRN

## 2017-02-03 NOTE — Discharge Instructions (Signed)
Care of a Perineal Tear °A perineal tear is a cut (laceration) in the tissue between the opening of the vagina and the anus (perineum). Some women naturally develop a perineal tear during a vaginal birth. This can happen as the baby emerges from the birth canal and the perineum is stretched. Perineal tears are graded based on how deep and long the laceration is. The grading for perineal tears is as follows: °· First degree. This involves a shallow tear at the edge of the vaginal opening that extends slightly into the perineal skin. °· Second degree. This involves tearing described in a first degree perineal tear and also a deeper tear of the vaginal opening and perineal tissues. It may also include tearing of a muscle just under the perineal skin. °· Third degree. This involves tearing described in a first and second degree perineal tear, with the tear extending into the muscle of the anus (anal sphincter). °· Fourth degree. This involves all levels of tear described for first, second, and third degree perineal tear, with the tear extending into the rectum. ° °First degree perineal tears may or may not be stitched closed, depending on their location and appearance. Second, third, and fourth degree perineal tears are stitched closed immediately after the baby’s birth. °What are the risks? °Depending on the type of perineal tear you have, you may be at risk for the following: °· Bleeding. °· Developing a collection of blood in the perineal tear area (hematoma). °· Pain. This may include pain with urination or bowel movements. °· Infection at the site of the tear. °· Fever. °· Trouble controlling your bowels (fecal incontinence). °· Painful sexual intercourse. ° °How to care for a perineal tear °· The first day, put ice on the area of the tear. °? Put ice in a plastic bag. °? Place a towel between your skin and the bag. °? Leave the ice on for 20 minutes, 2-3 times a day. °· Bathe using a warm sitz bath as directed by  your health care provider. This can speed up healing. Sitz baths can be performed in your bathtub or using a sitz bath kit that fits over your toilet. °? Place 3-4 in. (7.6-10 cm) of warm water in your bathtub or fill the sitz bath over-the-toilet container with warm water. Make sure the water is not too hot by placing a drop on your wrist. °? Sit in the warm water for 20-30 minutes. °? After bathing, pat your perineum dry with a clean towel. Do not scrub the perineum as this could cause pain, irritation, or open any stitches you may have. °? Keep the over-the-toilet sitz bath container clean by rinsing it thoroughly after each use. Ask for help in keeping the bathtub clean with diluted bleach and water (2 Tbsp [30 mL] of bleach to ½ gal [1.9 L] of water). °? Repeat the sitz bath as often as you would like to relieve perineal pain, itching, or discomfort. °· Apply a numbing spray to the perineal tear site as directed by your health care provider. This may help with discomfort. °· Wash your hands before and after applying medicine to the area. °· Put about 3 witch hazel-containing hemorrhoid treatment pads on top of your sanitary pad. The witch hazel in the hemorrhoid pads helps with discomfort and swelling. °· Get a squeeze bottle to squeeze warm water on your perineum when urinating, spraying the area from front to back. Pat the area to dry it. °· Sitting on an inflatable   ring or pillow may provide comfort.  Take medicines only as directed by your health care provider.  Do not have sexual intercourse or use tampons until your health care provider says it is okay. Typically, you must wait at least 6 weeks.  Keep all postpartum appointments as directed by your health care provider. Contact a health care provider if:  Your pain is not relieved with medicines.  You have painful urination.  You have a fever. Get help right away if:  You have redness, swelling, or increasing pain in the area of the  tear.  You have pus coming from the area of the tear.  You notice a bad smell coming from the area of the tear.  Your tear opens.  You notice swelling in the area of the tear that is larger than when you left the hospital.  You cannot urinate. This information is not intended to replace advice given to you by your health care provider. Make sure you discuss any questions you have with your health care provider. Document Released: 10/17/2013 Document Revised: 11/14/2015 Document Reviewed: 03/08/2013 Elsevier Interactive Patient Education  2017 [place] Instructions for Mom ACTIVITY  Gradually return to your regular activities.  Let yourself rest. Nap while your baby sleeps.  Avoid lifting anything that is heavier than 10 lb (4.5 kg) until your health care provider says it is okay.  Avoid activities that take a lot of effort and energy (are strenuous) until approved by your health care provider. Walking at a slow-to-moderate pace is usually safe.  If you had a cesarean delivery: ? Do not vacuum, climb stairs, or drive a car for 4-6 weeks. ? Have someone help you at home until you feel like you can do your usual activities yourself. ? Do exercises as told by your health care provider, if this applies.  VAGINAL BLEEDING You may continue to bleed for 4-6 weeks after delivery. Over time, the amount of blood usually decreases and the color of the blood usually gets lighter. However, the flow of bright red blood may increase if you have been too active. If you need to use more than one pad in an hour because your pad gets soaked, or if you pass a large clot:  Lie down.  Raise your feet.  Place a cold compress on your lower abdomen.  Rest.  Call your health care provider.  If you are breastfeeding, your period should return anytime between 8 weeks after delivery and the time that you stop breastfeeding. If you are not breastfeeding, your period should return 6-8  weeks after delivery. PERINEAL CARE The perineal area, or perineum, is the part of your body between your thighs. After delivery, this area needs special care. Follow these instructions as told by your health care provider.  Take warm tub baths for 15-20 minutes.  Use medicated pads and pain-relieving sprays and creams as told.  Do not use tampons or douches until vaginal bleeding has stopped.  Each time you go to the bathroom: ? Use a peri bottle. ? Change your pad. ? Use towelettes in place of toilet paper until your stitches have healed.  Do Kegel exercises every day. Kegel exercises help to maintain the muscles that support the vagina, bladder, and bowels. You can do these exercises while you are standing, sitting, or lying down. To do Kegel exercises: ? Tighten the muscles of your abdomen and the muscles that surround your birth canal. ? Hold for a few seconds. ?  Relax. ? Repeat until you have done this 5 times in a row.  To prevent hemorrhoids from developing or getting worse: ? Drink enough fluid to keep your urine clear or pale yellow. ? Avoid straining when having a bowel movement. ? Take over-the-counter medicines and stool softeners as told by your health care provider.  BREAST CARE  Wear a tight-fitting bra.  Avoid taking over-the-counter pain medicine for breast discomfort.  Apply ice to the breasts to help with discomfort as needed: ? Put ice in a plastic bag. ? Place a towel between your skin and the bag. ? Leave the ice on for 20 minutes or as told by your health care provider.  NUTRITION  Eat a well-balanced diet.  Do not try to lose weight quickly by cutting back on calories.  Take your prenatal vitamins until your postpartum checkup or until your health care provider tells you to stop.  POSTPARTUM DEPRESSION You may find yourself crying for no apparent reason and unable to cope with all of the changes that come with having a newborn. This mood is  called postpartum depression. Postpartum depression happens because your hormone levels change after delivery. If you have postpartum depression, get support from your partner, friends, and family. If the depression does not go away on its own after several weeks, contact your health care provider. BREAST SELF-EXAM Do a breast self-exam each month, at the same time of the month. If you are breastfeeding, check your breasts just after a feeding, when your breasts are less full. If you are breastfeeding and your period has started, check your breasts on day 5, 6, or 7 of your period. Report any lumps, bumps, or discharge to your health care provider. Know that breasts are normally lumpy if you are breastfeeding. This is temporary, and it is not a health risk. INTIMACY AND SEXUALITY Avoid sexual activity for at least 3-4 weeks after delivery or until the brownish-red vaginal flow is completely gone. If you want to avoid pregnancy, use some form of birth control. You can get pregnant after delivery, even if you have not had your period. SEEK MEDICAL CARE IF:  You feel unable to cope with the changes that a child brings to your life, and these feelings do not go away after several weeks.  You notice a lump, a bump, or discharge on your breast.  SEEK IMMEDIATE MEDICAL CARE IF:  Blood soaks your pad in 1 hour or less.  You have: ? Severe pain or cramping in your lower abdomen. ? A bad-smelling vaginal discharge. ? A fever that is not controlled by medicine. ? A fever, and an area of your breast is red and sore. ? Pain or redness in your calf. ? Sudden, severe chest pain. ? Shortness of breath. ? Painful or bloody urination. ? Problems with your vision.  You vomit for 12 hours or longer.  You develop a severe headache.  You have serious thoughts about hurting yourself, your child, or anyone else.  This information is not intended to replace advice given to you by your health care provider.  Make sure you discuss any questions you have with your health care provider. Document Released: 05/30/2000 Document Revised: 11/08/2015 Document Reviewed: 12/04/2014 Elsevier Interactive Patient Education  2017 [employer].

## 2017-02-03 NOTE — Discharge Summary (Signed)
Obstetric Discharge Summary Reason for Admission: IOL due to Low PAPP-A at 39 weeks, PNC at [facility] with risk for Stillborn, IUGR and PIH Prenatal Procedures: Genetic testing (AFP neg), [place], AFI Intrapartum Procedures: Cytotec, External fetal and uterine monitors Postpartum Procedures: Perineal care Complications-Operative and Postpartum: N/A Hemoglobin  Date Value Ref Range Status  02/04/2017 12.0 12.0 - 16.0 g/dL Final   HGB  Date Value Ref Range Status  03/13/2013 13.6 12.0 - 16.0 g/dL Final   HCT  Date Value Ref Range Status  02/04/2017 35.2 35.0 - 47.0 % Final  03/13/2013 39.1 35.0 - 47.0 % Final    Physical Exam:  General:A,A&O x3 Lochia: Mod, no clots Uterine Fundus:FF, Below umbilicus Incision:Perineum healing DVT Evaluation: Neg Homans   Discharge Diagnoses: Term iUP at 39 weeks for IOL due to low PAPP-A, NSVD of viable female with 2nd deg lac  Discharge Information: Date: 02/05/2017 Activity: Up ad lib  Diet:Regular Medications: PNV, Fe Condition: Stable  Instructions: No sex x 6 weeks, No driving x 2  weeks Discharge to: Home Newborn Data: Live born female  Birth Weight: 6 lb 13 oz (3090 g) APGAR: 8, 9  Home with Mom  Alison Banks 02/05/2017, 10:31 AM

## 2017-02-04 LAB — CBC
HEMATOCRIT: 35.2 % (ref 35.0–47.0)
Hemoglobin: 12 g/dL (ref 12.0–16.0)
MCH: 30 pg (ref 26.0–34.0)
MCHC: 34 g/dL (ref 32.0–36.0)
MCV: 88.4 fL (ref 80.0–100.0)
Platelets: 176 10*3/uL (ref 150–440)
RBC: 3.99 MIL/uL (ref 3.80–5.20)
RDW: 14.4 % (ref 11.5–14.5)
WBC: 13.6 10*3/uL — ABNORMAL HIGH (ref 3.6–11.0)

## 2017-02-04 LAB — RPR: RPR: NONREACTIVE

## 2017-02-04 NOTE — Lactation Note (Signed)
This note was copied from a baby's chart. Lactation Consultation Note  Patient Name: Alison Banks SMOLM'B Date: 02/04/2017 Reason for consult: Follow-up assessment   Maternal Data    Mom has been diligent with trying to nurse baby often and with correct position and latch. Plenty of diapers and only 3 % weight loss, but nipples are increasingly sore and looked pinched after each feed. I had her try reclined hold and football hold while assisting her throughout the feeds and we got same results. He does have a slightly receding chin (more apparent at rest) and Mom's right nipple is a bit "bivalved" with a crease in the middle. These findings are likely causes of pain and trauma. He does not appear to have any  lip or tongue frenulum issues. Tongue extends past gumline, reaches palate easliy and good lateral movement. Lips can flange well. I finally had her try nipple shield which immediately improved her comfort and his quality of rhythmic suck/swallow pattern. Mom applied shield correctly herself. She knows this is intended to be a briefly used tool. As soon as he can latch on correctly without causing nipple damage, we want to wean him off shield. I would like her feeding plan re-assessed tomorrow by [facility]  Feeding Feeding Type: Breast Fed Length of feed: 5 min  LATCH Score Latch: Grasps breast easily, tongue down, lips flanged, rhythmical sucking. (with 20 mm nipple shield)  Audible Swallowing: A few with stimulation  Type of Nipple: Everted at rest and after stimulation  Comfort (Breast/Nipple): Soft / non-tender  Hold (Positioning): No assistance needed to correctly position infant at breast.  LATCH Score: 9  Interventions Interventions:  (nipple shield to prevent pinching of nipple)  Lactation Tools Discussed/Used     Consult Status Consult Status: Follow-up Date: 02/05/17 Follow-up type: In-patient    Sunday Corn 02/04/2017, 3:52 PM

## 2017-02-04 NOTE — Lactation Note (Signed)
This note was copied from a baby's chart. Lactation Consultation Note  Patient Name: Alison Banks GLOVF'I Date: 02/04/2017 Reason for consult: Follow-up assessment   Maternal Data  Mom states this last feeding with the nipple shield felt a lot better than other feeds and no more nipple pinching. She was able to nurse him until he fell asleep after hearing many swallows during 20 minutes of  Breastfeeding.   Feeding Feeding Type: Breast Fed Length of feed: 20 min  LATCH Score Latch: Grasps breast easily, tongue down, lips flanged, rhythmical sucking. (with 20 mm nipple shield)  Audible Swallowing: A few with stimulation  Type of Nipple: Everted at rest and after stimulation  Comfort (Breast/Nipple): Soft / non-tender  Hold (Positioning): No assistance needed to correctly position infant at breast.  LATCH Score: 9  Interventions Interventions:  (nipple shield to prevent pinching of nipple)  Lactation Tools Discussed/Used     Consult Status      Alison Banks 02/04/2017, 4:21 PM

## 2017-02-04 NOTE — Progress Notes (Signed)
Post Partum Day 1 Subjective: Doing well, no complaints.  Tolerating regular diet, pain with PO meds, voiding and ambulating without difficulty.  No CP SOB F/C N/V or leg pain    Objective: BP 114/70 (BP Location: Left Arm)   Pulse 89   Temp 97.8 F (36.6 C) (Oral)   Resp 18   Ht 5\' 5"  (1.651 m)   Wt 70.8 kg (156 lb)   LMP 05/05/2015   SpO2 98%   BMI 25.96 kg/m    Physical Exam:  General: NAD CV: RRR Pulm: nl effort, CTABL Lochia: moderate Uterine Fundus: fundus firm and below umbilicus DVT Evaluation: no cords, ttp LEs    Recent Labs  02/03/17 0819 02/04/17 0906  HGB 13.1 12.0  HCT 37.6 35.2  WBC 22.6* 13.6*  PLT 188 176    Assessment/Plan: 31 y.o. G1P0 postpartum day # 1  1. S/p SVD but did not have adequate treatment for GBS - baby required to stay for 48hrs per hosptial protocol. 2. Continue routine post partum cares 3. Leukocytosis resolved. 4. Anticipated d/c tomorrow.    ----- Ranae Plumber, MD Attending Obstetrician and Gynecologist [facility]

## 2017-02-05 MED ORDER — VALACYCLOVIR HCL 500 MG PO TABS
1000.0000 mg | ORAL_TABLET | Freq: Two times a day (BID) | ORAL | Status: AC
  Administered 2017-02-05 (×2): 1000 mg via ORAL
  Filled 2017-02-05 (×2): qty 2

## 2017-02-05 MED ORDER — TRIPLE ANTIBIOTIC 3.5-400-5000 EX OINT
TOPICAL_OINTMENT | CUTANEOUS | Status: DC | PRN
  Administered 2017-02-05: 1 via TOPICAL
  Filled 2017-02-05 (×3): qty 1

## 2017-02-05 NOTE — Progress Notes (Signed)
Patient discharged home with infant and significant other. Discharge instructions, prescriptions and follow up appointment given to and reviewed with patient and significant other. Patient verbalized understanding. Escorted out via wheelchair by auxiliary.  

## 2018-12-10 DIAGNOSIS — Z3493 Encounter for supervision of normal pregnancy, unspecified, third trimester: Secondary | ICD-10-CM | POA: Insufficient documentation

## 2018-12-10 LAB — OB RESULTS CONSOLE HEPATITIS B SURFACE ANTIGEN: Hepatitis B Surface Ag: NEGATIVE

## 2018-12-10 LAB — OB RESULTS CONSOLE RUBELLA ANTIBODY, IGM: Rubella: IMMUNE

## 2018-12-10 LAB — OB RESULTS CONSOLE VARICELLA ZOSTER ANTIBODY, IGG: Varicella: IMMUNE

## 2019-06-06 ENCOUNTER — Ambulatory Visit: Payer: BC Managed Care – PPO | Attending: Internal Medicine

## 2019-06-06 DIAGNOSIS — Z20822 Contact with and (suspected) exposure to covid-19: Secondary | ICD-10-CM

## 2019-06-08 LAB — OB RESULTS CONSOLE HIV ANTIBODY (ROUTINE TESTING): HIV: NONREACTIVE

## 2019-06-08 LAB — OB RESULTS CONSOLE GC/CHLAMYDIA
Chlamydia: NEGATIVE
Gonorrhea: NEGATIVE

## 2019-06-08 LAB — OB RESULTS CONSOLE RPR: RPR: NONREACTIVE

## 2019-06-08 LAB — NOVEL CORONAVIRUS, NAA: SARS-CoV-2, NAA: NOT DETECTED

## 2019-06-08 LAB — OB RESULTS CONSOLE GBS: GBS: NEGATIVE

## 2019-06-17 NOTE — L&D Delivery Note (Signed)
Date of delivery:06/21/2019 Estimated Date of Delivery: 06/29/19 Patient's last menstrual period was 09/28/2018. EGA: [redacted]w[redacted]d  Delivery Note At 9:38 PM a viable female was delivered via Vaginal, Spontaneous (Presentation: cephalic, ROA).  APGAR: 8, 9; weight pending skin-to-skin.   Placenta status: Spontaneous, Intact.  Cord: 3 vessels with the following complications: None.  Cord pH: not collected  Anesthesia: Epidural Episiotomy: None Lacerations:  Skin abrasion Suture Repair: 3.0 vicryl rapide Est. Blood Loss (mL): 176  Mom presented to L&D with labor.  epidual placed   SROM.  Progressed to complete, second stage: one contraction.  delivery of fetal head with minimal restitution.   Anterior then posterior shoulders delivered without difficulty.  Baby placed on mom's chest, and attended to by peds. Cord was then clamped and cut by FOB when pulseless.  Placenta spontaneously delivered, intact.   IV pitocin given for hemorrhage prophylaxis. 3-0 vicryl rapide placed in a figure of 8 across the hymen.   We sang happy birthday to baby boy to be named yet.   Mom to postpartum.  Baby to Couplet care / Skin to Skin.  Alison Banks 06/21/2019, 10:39 PM

## 2019-06-21 ENCOUNTER — Inpatient Hospital Stay: Payer: BC Managed Care – PPO | Admitting: Anesthesiology

## 2019-06-21 ENCOUNTER — Other Ambulatory Visit: Payer: Self-pay

## 2019-06-21 ENCOUNTER — Inpatient Hospital Stay
Admission: EM | Admit: 2019-06-21 | Discharge: 2019-06-23 | DRG: 807 | Disposition: A | Discharge status: Home / Self Care | Payer: BC Managed Care – PPO | Attending: Obstetrics & Gynecology | Admitting: Obstetrics & Gynecology

## 2019-06-21 ENCOUNTER — Encounter: Payer: Self-pay | Admitting: *Deleted

## 2019-06-21 DIAGNOSIS — Z3A38 38 weeks gestation of pregnancy: Secondary | ICD-10-CM | POA: Diagnosis not present

## 2019-06-21 DIAGNOSIS — Z20822 Contact with and (suspected) exposure to covid-19: Secondary | ICD-10-CM | POA: Diagnosis present

## 2019-06-21 DIAGNOSIS — F329 Major depressive disorder, single episode, unspecified: Secondary | ICD-10-CM | POA: Diagnosis present

## 2019-06-21 DIAGNOSIS — O99344 Other mental disorders complicating childbirth: Secondary | ICD-10-CM | POA: Diagnosis present

## 2019-06-21 DIAGNOSIS — Z8659 Personal history of other mental and behavioral disorders: Secondary | ICD-10-CM

## 2019-06-21 DIAGNOSIS — F419 Anxiety disorder, unspecified: Secondary | ICD-10-CM | POA: Diagnosis present

## 2019-06-21 DIAGNOSIS — O26893 Other specified pregnancy related conditions, third trimester: Secondary | ICD-10-CM | POA: Diagnosis present

## 2019-06-21 LAB — CBC
HCT: 37.9 % (ref 36.0–46.0)
Hemoglobin: 13 g/dL (ref 12.0–15.0)
MCH: 30.4 pg (ref 26.0–34.0)
MCHC: 34.3 g/dL (ref 30.0–36.0)
MCV: 88.6 fL (ref 80.0–100.0)
Platelets: 173 10*3/uL (ref 150–400)
RBC: 4.28 MIL/uL (ref 3.87–5.11)
RDW: 13.6 % (ref 11.5–15.5)
WBC: 14.1 10*3/uL — ABNORMAL HIGH (ref 4.0–10.5)
nRBC: 0 % (ref 0.0–0.2)

## 2019-06-21 LAB — RESPIRATORY PANEL BY RT PCR (FLU A&B, COVID)
Influenza A by PCR: NEGATIVE
Influenza B by PCR: NEGATIVE
SARS Coronavirus 2 by RT PCR: NEGATIVE

## 2019-06-21 LAB — TYPE AND SCREEN
ABO/RH(D): B POS
Antibody Screen: NEGATIVE

## 2019-06-21 MED ORDER — FENTANYL 2.5 MCG/ML W/ROPIVACAINE 0.15% IN NS 100 ML EPIDURAL (ARMC)
EPIDURAL | Status: AC
  Filled 2019-06-21: qty 100

## 2019-06-21 MED ORDER — EPHEDRINE 5 MG/ML INJ
10.0000 mg | INTRAVENOUS | Status: DC | PRN
  Filled 2019-06-21: qty 2

## 2019-06-21 MED ORDER — DIPHENHYDRAMINE HCL 50 MG/ML IJ SOLN: 12.5000 mg | INTRAMUSCULAR | Status: DC | PRN

## 2019-06-21 MED ORDER — SOD CITRATE-CITRIC ACID 500-334 MG/5ML PO SOLN: 30.0000 mL | ORAL | Status: DC | PRN

## 2019-06-21 MED ORDER — OXYTOCIN BOLUS FROM INFUSION
500.0000 mL | Freq: Once | INTRAVENOUS | Status: AC
  Administered 2019-06-21: 22:00:00 500 mL via INTRAVENOUS

## 2019-06-21 MED ORDER — FENTANYL 2.5 MCG/ML W/ROPIVACAINE 0.15% IN NS 100 ML EPIDURAL (ARMC)
12.0000 mL/h | EPIDURAL | Status: DC
  Administered 2019-06-21: 12 mL/h via EPIDURAL

## 2019-06-21 MED ORDER — BUTORPHANOL TARTRATE 1 MG/ML IJ SOLN: 1.0000 mg | INTRAMUSCULAR | Status: DC | PRN

## 2019-06-21 MED ORDER — LACTATED RINGERS IV SOLN: 500.0000 mL | INTRAVENOUS | Status: DC | PRN

## 2019-06-21 MED ORDER — OXYTOCIN 40 UNITS IN NORMAL SALINE INFUSION - SIMPLE MED
INTRAVENOUS | Status: AC
  Filled 2019-06-21: qty 1000

## 2019-06-21 MED ORDER — LACTATED RINGERS IV SOLN: 500.0000 mL | Freq: Once | INTRAVENOUS | Status: DC

## 2019-06-21 MED ORDER — LIDOCAINE-EPINEPHRINE (PF) 1.5 %-1:200000 IJ SOLN
INTRAMUSCULAR | Status: DC | PRN
  Administered 2019-06-21: 3 mL via EPIDURAL

## 2019-06-21 MED ORDER — ONDANSETRON HCL 4 MG/2ML IJ SOLN: 4.0000 mg | Freq: Four times a day (QID) | INTRAMUSCULAR | Status: DC | PRN

## 2019-06-21 MED ORDER — OXYTOCIN 40 UNITS IN NORMAL SALINE INFUSION - SIMPLE MED: 2.5000 [IU]/h | INTRAVENOUS | Status: DC

## 2019-06-21 MED ORDER — OXYTOCIN 10 UNIT/ML IJ SOLN
INTRAMUSCULAR | Status: AC
  Filled 2019-06-21: qty 2

## 2019-06-21 MED ORDER — ACETAMINOPHEN 325 MG PO TABS: 650.0000 mg | ORAL_TABLET | ORAL | Status: DC | PRN

## 2019-06-21 MED ORDER — AMMONIA AROMATIC IN INHA
RESPIRATORY_TRACT | Status: AC
  Filled 2019-06-21: qty 10

## 2019-06-21 MED ORDER — LIDOCAINE HCL (PF) 1 % IJ SOLN
30.0000 mL | INTRAMUSCULAR | Status: AC | PRN
  Administered 2019-06-21: 1 mL via SUBCUTANEOUS

## 2019-06-21 MED ORDER — LIDOCAINE HCL (PF) 1 % IJ SOLN
INTRAMUSCULAR | Status: AC
  Filled 2019-06-21: qty 30

## 2019-06-21 MED ORDER — MISOPROSTOL 200 MCG PO TABS
ORAL_TABLET | ORAL | Status: AC
  Filled 2019-06-21: qty 4

## 2019-06-21 MED ORDER — PHENYLEPHRINE 40 MCG/ML (10ML) SYRINGE FOR IV PUSH (FOR BLOOD PRESSURE SUPPORT)
80.0000 ug | PREFILLED_SYRINGE | INTRAVENOUS | Status: DC | PRN
  Filled 2019-06-21: qty 10

## 2019-06-21 MED ORDER — LACTATED RINGERS IV SOLN
INTRAVENOUS | Status: DC
  Administered 2019-06-21: 1000 mL via INTRAVENOUS

## 2019-06-21 MED ORDER — SODIUM CHLORIDE 0.9 % IV SOLN
INTRAVENOUS | Status: DC | PRN
  Administered 2019-06-21 (×2): 5 mL via EPIDURAL

## 2019-06-21 NOTE — Discharge Instructions (Signed)

## 2019-06-21 NOTE — Anesthesia Preprocedure Evaluation (Signed)
Anesthesia Evaluation  Patient identified by MRN, date of birth, ID band Patient awake    Reviewed: Allergy & Precautions, H&P , NPO status , Patient's Chart, lab work & pertinent test results  Airway Mallampati: III  TM Distance: >3 FB Neck ROM: full    Dental  (+) Chipped   Pulmonary neg pulmonary ROS,           Cardiovascular Exercise Tolerance: Good negative cardio ROS       Neuro/Psych    GI/Hepatic negative GI ROS,   Endo/Other    Renal/GU   negative genitourinary   Musculoskeletal   Abdominal   Peds  Hematology negative hematology ROS (+)   Anesthesia Other Findings Past Medical History: No date: Medical history non-contributory  Past Surgical History: No date: NO PAST SURGERIES  BMI    Body Mass Index: 26.29 kg/m      Reproductive/Obstetrics (+) Pregnancy                             Anesthesia Physical Anesthesia Plan  ASA: II  Anesthesia Plan: Epidural   Post-op Pain Management:    Induction:   PONV Risk Score and Plan:   Airway Management Planned:   Additional Equipment:   Intra-op Plan:   Post-operative Plan:   Informed Consent: I have reviewed the patients History and Physical, chart, labs and discussed the procedure including the risks, benefits and alternatives for the proposed anesthesia with the patient or authorized representative who has indicated his/her understanding and acceptance.       Plan Discussed with: Anesthesiologist  Anesthesia Plan Comments:         Anesthesia Quick Evaluation

## 2019-06-21 NOTE — H&P (Signed)
OB History & Physical   History of Present Illness:  Chief Complaint: contractions  HPI:  Alison Banks is a 34 y.o. G2P1001 female at [redacted]w[redacted]d dated by [redacted]w[redacted]d ultrasound. She presents to L&D with contractions.  She reports:  -active fetal movement -no leakage of fluid  -no vaginal bleeding  -onset of contractions at 11:30a, currently every 2-3 minutes  Pregnancy Issues: 1. Subchorionic hemorrhage, resolved 2. 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 History of anxiety and depression, taking Buspar 5mg  daily   Maternal Medical History:   Past Medical History:  Diagnosis Date  . Medical history non-contributory     No past surgical history on file.  Allergies  Allergen Reactions  . Penicillins Itching    Prior to Admission medications   Medication Sig Start Date End Date Taking? Authorizing Provider  acetaminophen (TYLENOL) 325 MG tablet Take 650 mg by mouth every 6 (six) hours as needed.    [provider]  Prenatal Vit-Fe Fumarate-FA (MULTIVITAMIN-PRENATAL) 27-0.8 MG TABS tablet Take 1 tablet by mouth daily at 12 noon.    [provider]  valACYclovir (VALTREX) 1000 MG tablet Take 1 tablet (1,000 mg total) by mouth 3 (three) times daily. 02/13/16   , MD     Prenatal care site: [facility]  Social History: She  reports that she has never smoked. She has never used smokeless tobacco. She reports that she does not drink alcohol or use drugs.  Family History: family history includes Colon cancer in her paternal grandfather; Leukemia in her paternal grandfather; Ovarian cancer in her maternal grandmother.   Review of Systems: A full review of systems was performed and negative except as noted in the HPI.    Physical Exam:  Vital Signs: LMP 09/28/2018   General:   alert and cooperative  Skin:  normal and no rash or abnormalities  Neurologic:    Alert & oriented x 3  Lungs:   clear to auscultation bilaterally  normal respiratory effort  Heart:   regular rate and rhythm, S1, S2 normal, no murmur, click, rub or gallop  Abdomen:  soft, non-tender; bowel sounds normal; no masses,  no organomegaly  Pelvis:  External genitalia: normal general appearance  FHT:  140 BPM  Presentations: cephalic  Cervix:    Dilation: 7cm wtih bulging bag   Effacement: 100%   Station:  0   Consistency: soft   Position: anterior  Extremities: : non-tender, symmetric, mild edema bilaterally.  DTRs: 2+    EFW: 7.2lbs  No results found for this or any previous visit (from the past 24 hour(s)).  Pertinent Results:  Prenatal Labs: Blood type/Rh B+  Antibody screen neg  Rubella Immune  Varicella Immune  RPR NR  HBsAg Neg  HIV NR  GC neg  Chlamydia neg  Genetic screening AFP with increased risk T21, cfDNA negative, XY  1 hour GTT 99  3 hour GTT n/a  GBS negative   FHT: FHR: 140  bpm, variability: moderate,  accelerations:  Present,  decelerations:  Absent Category/reactivity:  Category I TOCO: regular, every 2-3 minutes SVE:   /   /      Cephalic by leopolds  No results found.   Assessment:  Alison Banks is a 34 y.o. G2P0 female at [redacted]w[redacted]d with labor.   Plan:  1. Admit to Labor & Delivery; consents reviewed and obtained  2. Fetal Well being  - Fetal Tracing: Cat 1 - GBS negative - Presentation: cephalic  confirmed by leopolds and vaginal exam   3. Routine OB: - Prenatal labs reviewed, as above - Rh positive - CBC & T&S on admit - Clear fluids, IVF  4. Monitoring of Labor: -  Contractions monitored with external toco in place -  Pelvis proven to [place] for continuous fetal monitoring  -  Maternal pain control as desired: IVPM, regional anesthesia -  Anticipate vaginal delivery  5. Post Partum Planning: - Infant feeding: breast - Contraception: POPs  ----- Larey Days, MD, [place] Attending Obstetrician and Gynecologist [facility], Department of [facility]

## 2019-06-21 NOTE — Anesthesia Procedure Notes (Signed)
Epidural Patient location during procedure: OB Start time: 06/21/2019 8:04 PM End time: 06/21/2019 8:06 PM  Staffing Anesthesiologist: Karyss Frese, Cleda Mccreedy, MD Performed: anesthesiologist   Preanesthetic Checklist Completed: patient identified, IV checked, site marked, risks and benefits discussed, surgical consent, monitors and equipment checked, pre-op evaluation and timeout performed  Epidural Patient position: sitting Prep: ChloraPrep Patient monitoring: heart rate, continuous pulse ox and blood pressure Approach: midline Location: L3-L4 Injection technique: LOR saline  Needle:  Needle type: Tuohy  Needle gauge: 17 G Needle length: 9 cm and 9 Needle insertion depth: 4.5 cm Catheter type: closed end flexible Catheter size: 19 Gauge Catheter at skin depth: 9.5 cm Test dose: negative and 1.5% lidocaine with Epi 1:200 K  Assessment Sensory level: T10 Events: blood not aspirated, injection not painful, no injection resistance, no paresthesia and negative IV test  Additional Notes 1 attempt Pt. Evaluated and documentation done after procedure finished. Patient identified. Risks/Benefits/Options discussed with patient including but not limited to bleeding, infection, nerve damage, paralysis, failed block, incomplete pain control, headache, blood pressure changes, nausea, vomiting, reactions to medication both or allergic, itching and postpartum back pain. Confirmed with bedside nurse the patient's most recent platelet count. Confirmed with patient that they are not currently taking any anticoagulation, have any bleeding history or any family history of bleeding disorders. Patient expressed understanding and wished to proceed. All questions were answered. Sterile technique was used throughout the entire procedure. Please see nursing notes for vital signs. Test dose was given through epidural catheter and negative prior to continuing to dose epidural or start infusion. Warning signs of  high block given to the patient including shortness of breath, tingling/numbness in hands, complete motor block, or any concerning symptoms with instructions to call for help. Patient was given instructions on fall risk and not to get out of bed. All questions and concerns addressed with instructions to call with any issues or inadequate analgesia.   Patient tolerated the insertion well without immediate complications.Reason for block:procedure for pain

## 2019-06-21 NOTE — Discharge Summary (Signed)
Obstetric Discharge Summary   Patient Name: Alison Banks DOB: [birth year] MRN: [id number]  Date of Admission: 06/21/2019 Date of Delivery: 06/21/2019 Delivered by: Ranae Plumber, MD Date of Discharge: 06/23/2019  Primary OB: [facility]  Alison Banks'S last menstrual period was 09/28/2018. EDC Estimated Date of Delivery: 06/29/19 Gestational Age at Delivery: [redacted]w[redacted]d   Antepartum complications:  1. Subchorionic hemorrhage, resolved 2. 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 History of anxiety and depression, taking Buspar 5mg  daily  Admitting Diagnosis: active labor  Secondary Diagnoses: Patient Active Problem List   Diagnosis Date Noted  . NSVD (normal spontaneous vaginal delivery) 06/23/2019  . History of depression 06/23/2019    Augmentation: none Complications: None Intrapartum complications/course:Mom presented to L&D with labor.  epidual placed   SROM.  Progressed to complete, second stage: one contraction.  delivery of fetal head with minimal restitution.   Anterior then posterior shoulders delivered without difficulty.  Baby placed on mom's chest, and attended to by peds. Cord was then clamped and cut by FOB when pulseless.  Placenta spontaneously delivered, intact.   IV pitocin given for hemorrhage prophylaxis. 3-0 vicryl rapide placed in a figure of 8 across the hymen.  Delivery Type: spontaneous vaginal delivery Anesthesia: epidural Placenta: spontaneous Laceration: abrasion Episiotomy: none  Newborn Data: Live born female  Birth Weight: 3650g 8#0  APGAR: 8, 9  Newborn Delivery   Birth date/time: 06/21/2019 21:38:00 Delivery type: Vaginal, Spontaneous     Postpartum Course  Patient had an uncomplicated postpartum course.  By time of discharge on PPD#2, her pain was controlled on oral pain medications; she had appropriate lochia and was ambulating, voiding without difficulty and tolerating regular diet.  Baby is tongue tied so mom is  breastfeeding and pumping. She was deemed stable for discharge to home.       Labs: CBC Latest Ref Rng & Units 06/22/2019 06/21/2019 02/04/2017  WBC 4.0 - 10.5 K/uL 16.8(H) 14.1(H) 13.6(H)  Hemoglobin 12.0 - 15.0 g/dL 08/19/2019 02/06/2017 97.0  Hematocrit 36.0 - 46.0 % 37.2 37.9 35.2  Platelets 150 - 400 K/uL 159 173 176   B POS  Physical exam:  BP (!) 92/54 (BP Location: Left Arm)   Pulse 91   Temp 98.5 F (36.9 C) (Oral)   Resp 16   Ht 5\' 5"  (1.651 m)   Wt 71.7 kg   LMP 09/28/2018   SpO2 99%   Breastfeeding Unknown   BMI 26.29 kg/m  General: alert and no distress Pulm: normal respiratory effort Lochia: appropriate Abdomen: soft, NT Uterine Fundus: firm, below umbilicus Extremities: No evidence of DVT seen on physical exam. No lower extremity edema.   Disposition: stable, discharge to home Baby Feeding: breastmilk  Baby Disposition: home with mom  Contraception: progestin only pills  Prenatal Labs:  Blood type/Rh B+  Antibody screen neg  Rubella Immune  Varicella Immune  RPR NR  HBsAg Neg  HIV NR  GC neg  Chlamydia neg  Genetic screening AFP with increased risk T21, cfDNA negative, XY  1 hour GTT 99  3 hour GTT n/a  GBS negative   Rh Immune globulin given: n/a Rubella vaccine given: n/a Varicella vaccine given: n/a Tdap vaccine given in AP or PP setting: 04/07/2019 Flu vaccine given in AP or PP setting: declined 03/08/2019  Plan:  Alison Banks was discharged to home in good condition. Follow-up appointment at [facility] with Dr. 03/10/2019 in 6 weeks  Discharge Instructions: Per After Visit Summary. Activity: Advance  as tolerated. Pelvic rest for 6 weeks.   Diet: Regular Discharge Medications: Allergies as of 06/23/2019      Reactions   Penicillins Itching      Medication List    STOP taking these medications   acetaminophen 325 MG tablet Commonly known as: TYLENOL   valACYclovir 1000 MG tablet Commonly known as: VALTREX     TAKE these medications    multivitamin-prenatal 27-0.8 MG Tabs tablet Take 1 tablet by mouth daily at 12 noon.      Outpatient follow up:  Follow-up Information    Ward, Alison Loh, MD. Schedule an appointment as soon as possible for a visit in 6 week(s).   Specialty: Obstetrics and Gynecology Why: For postpartum visit Contact information: [place] [address] [phone]            Signed: Clydene Laming CNM 06/23/19 8:04 AM

## 2019-06-22 LAB — CBC
HCT: 37.2 % (ref 36.0–46.0)
Hemoglobin: 12.5 g/dL (ref 12.0–15.0)
MCH: 30 pg (ref 26.0–34.0)
MCHC: 33.6 g/dL (ref 30.0–36.0)
MCV: 89.2 fL (ref 80.0–100.0)
Platelets: 159 10*3/uL (ref 150–400)
RBC: 4.17 MIL/uL (ref 3.87–5.11)
RDW: 13.7 % (ref 11.5–15.5)
WBC: 16.8 10*3/uL — ABNORMAL HIGH (ref 4.0–10.5)
nRBC: 0 % (ref 0.0–0.2)

## 2019-06-22 MED ORDER — DIBUCAINE (PERIANAL) 1 % EX OINT: 1.0000 "application " | TOPICAL_OINTMENT | CUTANEOUS | Status: DC | PRN

## 2019-06-22 MED ORDER — PRENATAL MULTIVITAMIN CH
1.0000 | ORAL_TABLET | Freq: Every day | ORAL | Status: DC
  Administered 2019-06-22: 1 via ORAL
  Filled 2019-06-22: qty 1

## 2019-06-22 MED ORDER — COCONUT OIL OIL
1.0000 "application " | TOPICAL_OIL | Status: DC | PRN
  Filled 2019-06-22: qty 120

## 2019-06-22 MED ORDER — WITCH HAZEL-GLYCERIN EX PADS
1.0000 "application " | MEDICATED_PAD | CUTANEOUS | Status: DC
  Administered 2019-06-22: 1 via TOPICAL
  Filled 2019-06-22: qty 100

## 2019-06-22 MED ORDER — SIMETHICONE 80 MG PO CHEW: 80.0000 mg | CHEWABLE_TABLET | ORAL | Status: DC | PRN

## 2019-06-22 MED ORDER — ONDANSETRON HCL 4 MG PO TABS: 4.0000 mg | ORAL_TABLET | ORAL | Status: DC | PRN

## 2019-06-22 MED ORDER — IBUPROFEN 600 MG PO TABS
600.0000 mg | ORAL_TABLET | Freq: Four times a day (QID) | ORAL | Status: DC
  Administered 2019-06-22 – 2019-06-23 (×5): 600 mg via ORAL
  Filled 2019-06-22 (×6): qty 1

## 2019-06-22 MED ORDER — DOCUSATE SODIUM 100 MG PO CAPS
100.0000 mg | ORAL_CAPSULE | Freq: Two times a day (BID) | ORAL | Status: DC
  Administered 2019-06-22 – 2019-06-23 (×4): 100 mg via ORAL
  Filled 2019-06-22 (×4): qty 1

## 2019-06-22 MED ORDER — BENZOCAINE-MENTHOL 20-0.5 % EX AERO
1.0000 "application " | INHALATION_SPRAY | CUTANEOUS | Status: DC | PRN
  Administered 2019-06-22: 1 via TOPICAL
  Filled 2019-06-22: qty 56

## 2019-06-22 MED ORDER — DIPHENHYDRAMINE HCL 25 MG PO CAPS: 25.0000 mg | ORAL_CAPSULE | Freq: Four times a day (QID) | ORAL | Status: DC | PRN

## 2019-06-22 MED ORDER — ACETAMINOPHEN 500 MG PO TABS: 1000.0000 mg | ORAL_TABLET | Freq: Four times a day (QID) | ORAL | Status: DC | PRN

## 2019-06-22 MED ORDER — ONDANSETRON HCL 4 MG/2ML IJ SOLN: 4.0000 mg | INTRAMUSCULAR | Status: DC | PRN

## 2019-06-22 NOTE — Progress Notes (Signed)
Post Partum Day 1 Subjective: Doing well, no complaints.  Tolerating regular diet, pain with PO meds, voiding and ambulating without difficulty.  No CP SOB Fever,Chills, N/V or leg pain; denies nipple or breast pain, no HA change of vision, RUQ/epigastric pain  Objective: BP 112/72 (BP Location: Right Arm)   Pulse 88   Temp 98 F (36.7 C) (Oral)   Resp 18   Ht 5\' 5"  (1.651 m)   Wt 71.7 kg   LMP 09/28/2018   SpO2 97%   Breastfeeding Unknown   BMI 26.29 kg/m    Physical Exam:  General: NAD Breasts: soft/nontender CV: RRR Pulm: nl effort, CTABL Abdomen: soft, NT, BS x 4 Perineum: minimal edema, intact/lacerations hemostatic/repair well approximated Lochia: moderate Uterine Fundus: fundus firm and 2 fb below umbilicus DVT Evaluation: no cords, ttp LEs   Recent Labs    06/21/19 1851 06/22/19 0638  HGB 13.0 12.5  HCT 37.9 37.2  WBC 14.1* 16.8*  PLT 173 159    Assessment/Plan: 34 y.o. G2P2002 postpartum day # 1  - Continue routine PP care- pt desires early DC tonight, may room-in if infant is not ready for DC.  - Lactation consult prn.  - Discussed contraceptive options including implant, IUDs hormonal and non-hormonal, injection, pills/ring/patch, condoms, and NFP. Desires POPs - Immunization status: all Imms up to date, declined flu vaccine.     Disposition: Does desire Dc home today.     34, CNM 06/22/2019  8:21 AM

## 2019-06-22 NOTE — Lactation Note (Signed)
This note was copied from a Alison's chart. Lactation Consultation Note  Patient Name: Banks Banks [id number] Date: 06/22/2019 Reason for consult: Initial assessment  Lactation introduction made. Mom is a G2P2 and reports that she breasted her older child for 14 months without complications.  Upon initial assessment of Banks Banks "Banks Banks", mother reports that breastfeeding went well overnight. Denies any pain, damage, or discomfort with latching. Mother's only concern is [employer] feeding for short 5 minute intervals.   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 Plan for outpatient tongue clip on Friday. Offered for outpatient lactation follow-up the Tuesday after procedure. Will schedule today.   Alison cueing, so offered assistance with placing Alison to breast. Prior to latch, it is noted that mother has red blisters on left nipple. Alison able to latch, mother reports only mild discomfort. When Alison removed from breast, nipple is compressed and inflammed. Also, no swallows noted with this feed attempt.   Discussed in depth the implications of tongue-tie for breastfeeding. Explained the importance of sufficient breast stimulation and milk transfer, benefits of nipple shield, hand expression, and pumping.   After demonstrating hand expression and giving options, mother requesting nipple shield and pumping at this time. Education and demonstration performed in regards to nipple shield use. Size 69mm nipple shield applied. Pump brought bedside, set-up, frequency, duration, hygiene and milk storage discussed. Used a size 30 flange, but discussed implications for needing to change sizes. Curved syringes placed in room to feed expressed milk at next feed attempt. Coconut oil given to mom and explained its use and benefits for her nipples.  Feeding plan discussed and advised for mom to place Alison to breast with nipple shield first with each feeding  attempt. Then to feed expressed milk from prior pump session with curved syringe and gloved finger. Mom encouraged to pump after each feed for a minimum of 7 minutes.   Mother and father deny any further questions or concerns at this time. Encouraged to call LC with next feed.   Will return for follow-up before end of day.  Maternal Data Formula Feeding for Exclusion: No Has patient been taught Hand Expression?: Yes Does the patient have breastfeeding experience prior to this delivery?: Yes  Feeding Feeding Type: Breast Fed  LATCH Score Latch: Repeated attempts needed to sustain latch, nipple held in mouth throughout feeding, stimulation needed to elicit sucking reflex.  Audible Swallowing: None  Type of Nipple: Everted at rest and after stimulation  Comfort (Breast/Nipple): Filling, red/small blisters or bruises, mild/mod discomfort(left breast)  Hold (Positioning): Assistance needed to correctly position infant at breast and maintain latch.  LATCH Score: 5  Interventions Interventions: Breast feeding basics reviewed;Assisted with latch;Breast massage;Hand express;Adjust position;Support pillows;Position options;Expressed milk;Coconut oil;DEBP;Hand pump  Lactation Tools Discussed/Used Tools: Pump;Coconut oil;Nipple Dorris Carnes;Other (comment);Flanges(curved tip syringe ) Nipple shield size: 24 Flange Size: 30 Breast pump type: Double-Electric Breast Pump WIC Program: No Pump Review: Setup, frequency, and cleaning;Milk Storage Initiated by:: Mickle Mallory, IBCLC Date initiated:: 06/22/19   Consult Status Consult Status: Follow-up Date: 06/22/19 Follow-up type: In-patient  Corlis Hove -NCAT Lactation Student   Danford Bad 06/22/2019, 10:39 AM

## 2019-06-22 NOTE — Lactation Note (Signed)
This note was copied from a baby's chart. Lactation Consultation Note  Patient Name: Alison Banks Date: 06/22/2019 Reason for consult: Follow-up assessment  Called to patient's room for help. Upon arrival, mother reports feeding Baby "Alison Banks" for ~5 minutes without the nipple shield. States baby was refusing to latch with the shield in place. Mother attempting to feed expressed colostrum via curved syringe, however baby is asleep with tip in the corner of cheek.   Offered assistance with demonstrating finger and syringe feeding. Baby able to feed ~4cc of mothers expressed milk with finger and syringe without much encouragement. Parents verbalize that they feel comfortable feeding with curved tip syringe now that it has been demonstrated and [name] Banks tolerated well.   No further assistance requested at this time. Parents feel comfortable with feeding plan overnight. Reassured that lactation is here the rest of the afternoon and will be back for follow-up in the am.    Maternal Data Formula Feeding for Exclusion: No Has patient been taught Hand Expression?: Yes Does the patient have breastfeeding experience prior to this delivery?: Yes  Feeding Feeding Type: Breast Fed  LATCH Score                   Interventions Interventions: Breast feeding basics reviewed;Expressed milk;Coconut oil;DEBP  Lactation Tools Discussed/Used Tools: Coconut oil;Pump;Flanges;Other (comment)(curved tip syringe) Breast pump type: Double-Electric Breast Pump   Consult Status Consult Status: Follow-up Date: 06/22/19 Follow-up type: In-patient   [facility] Student  Danford Bad 06/22/2019, 2:01 PM

## 2019-06-22 NOTE — Anesthesia Postprocedure Evaluation (Signed)
Anesthesia Post Note  Patient: Merchant navy officer  Procedure(s) Performed: AN AD HOC LABOR EPIDURAL  Patient location during evaluation: Mother Baby Anesthesia Type: Epidural Level of consciousness: awake, awake and alert, oriented and patient cooperative Pain management: pain level controlled Vital Signs Assessment: post-procedure vital signs reviewed and stable Respiratory status: spontaneous breathing, nonlabored ventilation and respiratory function stable Cardiovascular status: stable Postop Assessment: no headache, no backache, patient able to bend at knees, able to ambulate and adequate PO intake Anesthetic complications: no     Last Vitals:  Vitals:   06/22/19 0415 06/22/19 0817  BP: 106/70 112/72  Pulse: 79 88  Resp:  18  Temp: 36.6 C 36.7 C  SpO2: 98% 97%    Last Pain:  Vitals:   06/22/19 0817  TempSrc: Oral  PainSc:                  Lyn Records

## 2019-06-23 DIAGNOSIS — Z8659 Personal history of other mental and behavioral disorders: Secondary | ICD-10-CM

## 2019-06-23 NOTE — Lactation Note (Signed)
This note was copied from a baby's chart. Lactation Consultation Note  Patient Name: Alison Banks [id number] Date: 06/23/2019 Reason for consult: Follow-up assessment;Difficult latch(tongue tie)  LC in to see mom and baby [name] before discharge. Mom continued breastfeeding and pumping efforts overnight, reporting things to have gone well. Some sleepiness at the breast, mom utilizing expressed milk via syringe to keep baby feeding. Baby has tongue-tie release scheduled for 06/24/2019 at pediatrician's office, and outpatient lactation appointment at [facility] on 06/28/19 at 10:30am for follow-up feeding evaluation. Mom plans to continue with feedings at the breast and use of DEBP for milk protection supply. Mom is confident and has experience in breastfeeding. Provided reminders for wet/stool diapers expectations, newborn feeding cues, growth spurts, cluster feeding, and feedings with a nipple shield. Information given for outpatient lactation services and appointment, and community breastfeeding support groups. Encouraged to call out today if needed before being discharged.   Maternal Data Formula Feeding for Exclusion: No Has patient been taught Hand Expression?: Yes Does the patient have breastfeeding experience prior to this delivery?: Yes  Feeding    LATCH Score                   Interventions Interventions: Breast feeding basics reviewed;DEBP  Lactation Tools Discussed/Used     Consult Status Consult Status: Complete Date: 06/23/19 Follow-up type: Call as needed    Danford Bad 06/23/2019, 10:30 AM

## 2019-06-23 NOTE — Progress Notes (Signed)
Patient discharged home with infant. Discharge instructions, prescriptions and follow up appointment given to and reviewed with patient. Patient verbalized understanding. Pt wheeled out with infant by RN 

## 2019-06-28 ENCOUNTER — Ambulatory Visit: Payer: Self-pay

## 2019-06-28 NOTE — Lactation Note (Signed)
This note was copied from a baby's chart. Lactation Consultation Note  Patient Name: Alison Banks Today's Date: 06/28/2019     Maternal Data  Mother has sore, cracked nipples  Feeding  Alison Banks was weighed before and after the feeding. His starting weight was 3502 grams (7 lbs 11.6 oz). The ending weight was 3568 grams (7 lbs 13.9 oz). Alison Banks took 66 mL which is a little over 2 ounces at this feeding.   LATCH Score                   Interventions    Lactation Tools Discussed/Used  Haakaa silcone pump   Consult Status  Mother Parlow) and Alison Banks are here today for a lactation outpatient consultation. Mother states that Alison Banks just got his tongue clipped on Friday by the pediatrician and is doing much better latching since the procedure. Mother has scabs on her nipples from previous latch attempts during breastfeeding. Mother states she found that infant does better with latching when he is allowed to latch on his own. This also causes less pain for her. LC suggested using the side-lying position so she is not hurting her back when trying to breastfeed. Mother tried the side lying position and states she felt comfortable in this position and will practice more at home.   Lactation plan: Breast feed using the side lying position and pillows to ensure she is comfortable. Make sure that infant is tummy to tummy with ears shoulders and hips in line and parallel to ensure good positioning. Flange bottom lip by pulling down chin and roll out top lip using fingers so infant can get a deeper latch. Continue using haakaa and avoid pumping if infant is able to drain breasts efficiently. Use the "flipple latch technique" when latching infant.  Mother encouraged to ask OB provider to order "All Purpose Nipple Ointment" to heal her sore nipples. This is a compound medication with ingredients: Bactroban Ointment (not cream), betamethasone ointment (not cream) and Miconazole powder.    Arlyss Gandy 06/28/2019, 11:35 AM

## 2019-08-16 ENCOUNTER — Other Ambulatory Visit: Payer: BC Managed Care – PPO

## 2019-08-16 ENCOUNTER — Other Ambulatory Visit: Payer: Self-pay | Admitting: Obstetrics & Gynecology

## 2019-08-17 ENCOUNTER — Other Ambulatory Visit: Payer: Self-pay

## 2019-08-17 ENCOUNTER — Encounter
Admission: RE | Admit: 2019-08-17 | Discharge: 2019-08-17 | Disposition: A | Discharge status: Home / Self Care | Payer: BC Managed Care – PPO | Source: Ambulatory Visit | Attending: Obstetrics & Gynecology | Admitting: Obstetrics & Gynecology

## 2019-08-17 HISTORY — DX: Depression, unspecified: F32.A

## 2019-08-17 HISTORY — DX: Anxiety disorder, unspecified: F41.9

## 2019-08-17 HISTORY — DX: Other complications of anesthesia, initial encounter: T88.59XA

## 2019-08-17 NOTE — Pre-Procedure Instructions (Signed)
This patient will need to utilize a breast pump in the PACU or Recovery Room. Lactation consultants are aware and have supplied their [place] number to be reached if needed. [phone]. Rosann Auerbach will be the [facility] working on Friday. The PACU charge nurse is aware as well.   Patient was instructed to bring her pump from home and the [employer] pump kit attachments she received while on the Mother/Baby unit after delivery.

## 2019-08-17 NOTE — Patient Instructions (Signed)
Your procedure is scheduled on: Friday 08/19/19.  Report to DAY SURGERY DEPARTMENT LOCATED ON 2ND FLOOR MEDICAL MALL ENTRANCE. To find out your arrival time please call [phone] between 1PM - 3PM on Thursday 08/18/19.    Remember: Instructions that are not followed completely may result in serious medical risk, up to and including death, or upon the discretion of your surgeon and anesthesiologist your surgery may need to be rescheduled.      _X__ 1. Do not eat food after midnight the night before your procedure.                 No gum chewing or hard candies. You may drink clear liquids up to 2 hours                 before you are scheduled to arrive for your surgery- DO NOT drink clear                 liquids within 2 hours of the start of your surgery.                 Clear Liquids include:  water, apple juice without pulp, clear carbohydrate                 drink such as Clearfast or Gatorade, Black Coffee or Tea (Do not add                 anything to coffee or tea).   ** If there is a Pre-Surgery Ensure in your bag, Dr. Elesa Massed would like for you to finish drinking it 2 hours prior to your arrival time on the morning of your surgery. **   __X__2.  On the morning of surgery brush your teeth with toothpaste and water, you may rinse your mouth with mouthwash if you wish.  Do not swallow any toothpaste or mouthwash.      _X__ 3.  No Alcohol for 24 hours before or after surgery.    __X__4.  Notify your doctor if there is any change in your medical condition      (cold, fever, infections).      Do not wear jewelry, make-up, hairpins, clips or nail polish. Do not wear lotions, powders, or perfumes.  Do not shave 48 hours prior to surgery. Men may shave face and neck. Do not bring valuables to the hospital.     [facility] is not responsible for any belongings or valuables.   Contacts, dentures/partials or body piercings may not be worn into surgery. Bring a case for your  contacts, glasses or hearing aids, a denture cup will be supplied.     Patients discharged the day of surgery will not be allowed to drive home.     __X__ Take these medicines the morning of surgery with A SIP OF WATER:     1. busPIRone (BUSPAR)      __X__ Stop Anti-inflammatories 7 days before surgery such as Advil, Ibuprofen, Motrin, BC or Goodies Powder, Naprosyn, Naproxen, Aleve, Aspirin, Meloxicam. May take Tylenol if needed for pain or discomfort.     __X__ Don't start taking any new herbal supplements or vitamins prior to your procedure.

## 2019-08-18 ENCOUNTER — Other Ambulatory Visit
Admission: RE | Admit: 2019-08-18 | Discharge: 2019-08-18 | Disposition: A | Discharge status: Home / Self Care | Payer: BC Managed Care – PPO | Source: Ambulatory Visit | Attending: Obstetrics & Gynecology | Admitting: Obstetrics & Gynecology

## 2019-08-18 DIAGNOSIS — Z01812 Encounter for preprocedural laboratory examination: Secondary | ICD-10-CM | POA: Diagnosis present

## 2019-08-18 DIAGNOSIS — Z20822 Contact with and (suspected) exposure to covid-19: Secondary | ICD-10-CM | POA: Diagnosis not present

## 2019-08-18 LAB — CBC
HCT: 40.4 % (ref 36.0–46.0)
Hemoglobin: 13.2 g/dL (ref 12.0–15.0)
MCH: 29.1 pg (ref 26.0–34.0)
MCHC: 32.7 g/dL (ref 30.0–36.0)
MCV: 89.2 fL (ref 80.0–100.0)
Platelets: 180 10*3/uL (ref 150–400)
RBC: 4.53 MIL/uL (ref 3.87–5.11)
RDW: 12.9 % (ref 11.5–15.5)
WBC: 4 10*3/uL (ref 4.0–10.5)
nRBC: 0 % (ref 0.0–0.2)

## 2019-08-18 LAB — BASIC METABOLIC PANEL
Anion gap: 5 (ref 5–15)
BUN: 19 mg/dL (ref 6–20)
CO2: 27 mmol/L (ref 22–32)
Calcium: 8.9 mg/dL (ref 8.9–10.3)
Chloride: 109 mmol/L (ref 98–111)
Creatinine, Ser: 1.03 mg/dL — ABNORMAL HIGH (ref 0.44–1.00)
GFR calc Af Amer: >60 mL/min (ref >60)
GFR calc non Af Amer: >60 mL/min (ref >60)
Glucose, Bld: 81 mg/dL (ref 70–99)
Potassium: 4 mmol/L (ref 3.5–5.1)
Sodium: 141 mmol/L (ref 135–145)

## 2019-08-18 LAB — TYPE AND SCREEN
ABO/RH(D): B POS
Antibody Screen: NEGATIVE
Extend sample reason: UNDETERMINED

## 2019-08-19 ENCOUNTER — Ambulatory Visit
Admission: RE | Admit: 2019-08-19 | Discharge: 2019-08-19 | Disposition: A | Discharge status: Home / Self Care | Payer: BC Managed Care – PPO | Attending: Obstetrics & Gynecology | Admitting: Obstetrics & Gynecology

## 2019-08-19 ENCOUNTER — Other Ambulatory Visit: Payer: Self-pay

## 2019-08-19 ENCOUNTER — Encounter
Admission: RE | Disposition: A | Discharge status: Home / Self Care | Payer: Self-pay | Source: Home / Self Care | Attending: Obstetrics & Gynecology

## 2019-08-19 ENCOUNTER — Ambulatory Visit: Payer: BC Managed Care – PPO | Admitting: Anesthesiology

## 2019-08-19 ENCOUNTER — Encounter: Payer: Self-pay | Admitting: Obstetrics & Gynecology

## 2019-08-19 DIAGNOSIS — Z880 Allergy status to penicillin: Secondary | ICD-10-CM | POA: Diagnosis not present

## 2019-08-19 DIAGNOSIS — Z7982 Long term (current) use of aspirin: Secondary | ICD-10-CM | POA: Diagnosis not present

## 2019-08-19 DIAGNOSIS — Z20822 Contact with and (suspected) exposure to covid-19: Secondary | ICD-10-CM | POA: Diagnosis not present

## 2019-08-19 DIAGNOSIS — Z79899 Other long term (current) drug therapy: Secondary | ICD-10-CM | POA: Diagnosis not present

## 2019-08-19 DIAGNOSIS — F419 Anxiety disorder, unspecified: Secondary | ICD-10-CM | POA: Diagnosis not present

## 2019-08-19 HISTORY — PX: HYSTEROSCOPY WITH D & C: SHX1775

## 2019-08-19 LAB — POCT PREGNANCY, URINE: Preg Test, Ur: NEGATIVE

## 2019-08-19 LAB — SARS CORONAVIRUS 2 (TAT 6-24 HRS): SARS Coronavirus 2: NEGATIVE

## 2019-08-19 LAB — POC SARS CORONAVIRUS 2 AG: SARS Coronavirus 2 Ag: NEGATIVE

## 2019-08-19 SURGERY — DILATATION AND CURETTAGE /HYSTEROSCOPY
Anesthesia: General

## 2019-08-19 MED ORDER — MORPHINE SULFATE (PF) 4 MG/ML IV SOLN: 1.0000 mg | INTRAVENOUS | Status: DC | PRN

## 2019-08-19 MED ORDER — FAMOTIDINE 20 MG PO TABS: 20.0000 mg | ORAL_TABLET | Freq: Once | ORAL | Status: AC

## 2019-08-19 MED ORDER — SEVOFLURANE IN SOLN
RESPIRATORY_TRACT | Status: AC
  Filled 2019-08-19: qty 250

## 2019-08-19 MED ORDER — PROPOFOL 10 MG/ML IV BOLUS
INTRAVENOUS | Status: AC
  Filled 2019-08-19: qty 20

## 2019-08-19 MED ORDER — ACETAMINOPHEN 325 MG PO TABS: 650.0000 mg | ORAL_TABLET | ORAL | Status: DC | PRN

## 2019-08-19 MED ORDER — MIDAZOLAM HCL 2 MG/2ML IJ SOLN
INTRAMUSCULAR | Status: DC | PRN
  Administered 2019-08-19: 2 mg via INTRAVENOUS

## 2019-08-19 MED ORDER — FENTANYL CITRATE (PF) 100 MCG/2ML IJ SOLN: 25.0000 ug | INTRAMUSCULAR | Status: DC | PRN

## 2019-08-19 MED ORDER — MIDAZOLAM HCL 2 MG/2ML IJ SOLN
INTRAMUSCULAR | Status: AC
  Filled 2019-08-19: qty 2

## 2019-08-19 MED ORDER — ONDANSETRON HCL 4 MG/2ML IJ SOLN: 4.0000 mg | Freq: Once | INTRAMUSCULAR | Status: DC | PRN

## 2019-08-19 MED ORDER — DEXAMETHASONE SODIUM PHOSPHATE 10 MG/ML IJ SOLN
INTRAMUSCULAR | Status: DC | PRN
  Administered 2019-08-19: 10 mg via INTRAVENOUS

## 2019-08-19 MED ORDER — LIDOCAINE HCL (CARDIAC) PF 100 MG/5ML IV SOSY
PREFILLED_SYRINGE | INTRAVENOUS | Status: DC | PRN
  Administered 2019-08-19: 100 mg via INTRAVENOUS

## 2019-08-19 MED ORDER — FENTANYL CITRATE (PF) 100 MCG/2ML IJ SOLN
INTRAMUSCULAR | Status: AC
  Filled 2019-08-19: qty 2

## 2019-08-19 MED ORDER — PROPOFOL 10 MG/ML IV BOLUS
INTRAVENOUS | Status: DC | PRN
  Administered 2019-08-19: 30 mg via INTRAVENOUS
  Administered 2019-08-19: 50 mg via INTRAVENOUS
  Administered 2019-08-19: 120 mg via INTRAVENOUS

## 2019-08-19 MED ORDER — DOXYCYCLINE MONOHYDRATE 100 MG PO CAPS: 100.0000 mg | ORAL_CAPSULE | Freq: Two times a day (BID) | ORAL | 0 refills | Status: DC

## 2019-08-19 MED ORDER — KETOROLAC TROMETHAMINE 30 MG/ML IJ SOLN
INTRAMUSCULAR | Status: DC | PRN
  Administered 2019-08-19: 30 mg via INTRAVENOUS

## 2019-08-19 MED ORDER — ACETAMINOPHEN 650 MG RE SUPP
650.0000 mg | RECTAL | Status: DC | PRN
  Filled 2019-08-19: qty 1

## 2019-08-19 MED ORDER — FENTANYL CITRATE (PF) 100 MCG/2ML IJ SOLN
INTRAMUSCULAR | Status: DC | PRN
  Administered 2019-08-19: 25 ug via INTRAVENOUS
  Administered 2019-08-19: 50 ug via INTRAVENOUS
  Administered 2019-08-19: 25 ug via INTRAVENOUS

## 2019-08-19 MED ORDER — ONDANSETRON HCL 4 MG/2ML IJ SOLN
INTRAMUSCULAR | Status: DC | PRN
  Administered 2019-08-19: 4 mg via INTRAVENOUS

## 2019-08-19 MED ORDER — FAMOTIDINE 20 MG PO TABS
ORAL_TABLET | ORAL | Status: AC
  Administered 2019-08-19: 07:00:00 20 mg via ORAL
  Filled 2019-08-19: qty 1

## 2019-08-19 MED ORDER — LACTATED RINGERS IV SOLN: INTRAVENOUS | Status: DC

## 2019-08-19 SURGICAL SUPPLY — 20 items
COVER WAND RF STERILE (DRAPES) ×3 IMPLANT
DEVICE MYOSURE LITE (MISCELLANEOUS) IMPLANT
DEVICE MYOSURE REACH (MISCELLANEOUS) IMPLANT
ELECT REM PT RETURN 9FT ADLT (ELECTROSURGICAL) ×3
ELECTRODE REM PT RTRN 9FT ADLT (ELECTROSURGICAL) ×1 IMPLANT
GLOVE PI ORTHOPRO 6.5 (GLOVE) ×2
GLOVE PI ORTHOPRO STRL 6.5 (GLOVE) ×1 IMPLANT
GLOVE SURG SYN 6.5 ES PF (GLOVE) ×6 IMPLANT
GLOVE SURG SYN 6.5 PF PI (GLOVE) ×2 IMPLANT
GOWN STRL REUS W/ TWL LRG LVL3 (GOWN DISPOSABLE) ×2 IMPLANT
GOWN STRL REUS W/TWL LRG LVL3 (GOWN DISPOSABLE) ×6
IV LACTATED RINGERS 1000ML (IV SOLUTION) ×3 IMPLANT
KIT PROCEDURE FLUENT (KITS) IMPLANT
KIT TURNOVER CYSTO (KITS) ×3 IMPLANT
PACK DNC HYST (MISCELLANEOUS) ×3 IMPLANT
PAD OB MATERNITY 4.3X12.25 (PERSONAL CARE ITEMS) ×3 IMPLANT
PAD PREP 24X41 OB/GYN DISP (PERSONAL CARE ITEMS) ×3 IMPLANT
SEAL ROD LENS SCOPE MYOSURE (ABLATOR) ×3 IMPLANT
TUBING CONNECTING 10 (TUBING) ×2 IMPLANT
TUBING CONNECTING 10' (TUBING) ×1

## 2019-08-19 NOTE — H&P (Signed)
Preoperative History and Physical  Alison Banks is a 34 y.o. [id number] here for surgical management of continued postpartum bleeding with a 2.5cm thickened endometrial stripe.   No significant preoperative concerns.  Proposed surgery: dilation and curettage, hysteroscopy  Past Medical History:  Diagnosis Date  . Anxiety   . Complication of anesthesia   . Depression   . Medical history non-contributory    Past Surgical History:  Procedure Laterality Date  . COMPARTMENT SYNDROME MEASUREMENT    . NO PAST SURGERIES     OB History  Gravida Para Term Preterm AB Living  2 2 2     2   SAB TAB Ectopic Multiple Live Births        0 2    # Outcome Date GA Lbr Len/2nd Weight Sex Delivery Anes PTL Lv  2 Term 06/21/19 [redacted]w[redacted]d / 00:12 3650 g M Vag-Spont EPI  LIV  1 Term 02/03/17 [redacted]w[redacted]d  3204 g M Vag-Spont   LIV  Patient denies any other pertinent gynecologic issues.   No current facility-administered medications on file prior to encounter.   Current Outpatient Medications on File Prior to Encounter  Medication Sig Dispense Refill  . busPIRone (BUSPAR) 5 MG tablet Take 5 mg by mouth 2 (two) times daily.    Marland Kitchen docusate sodium (COLACE) 100 MG capsule Take 100 mg by mouth daily.    . Prenatal Vit-Fe Fumarate-FA (MULTIVITAMIN-PRENATAL) 27-0.8 MG TABS tablet Take 1 tablet by mouth daily at 12 noon.     Allergies  Allergen Reactions  . Penicillins Itching    Did it involve swelling of the face/tongue/throat, SOB, or low BP? No Did it involve sudden or severe rash/hives, skin peeling, or any reaction on the inside of your mouth or nose? No Did you need to seek medical attention at a hospital or doctor's office? Yes (was in labor at the time) When did it last happen? If all above answers are "NO", may proceed with cephalosporin use.     Social History:   reports that she has never smoked. She has never used smokeless tobacco. She reports that she does not drink alcohol or use drugs.  Family  History  Problem Relation Age of Onset  . Ovarian cancer Maternal Grandmother   . Colon cancer Paternal Grandfather   . Leukemia Paternal Grandfather     Review of Systems: Noncontributory  PHYSICAL EXAM: Blood pressure 107/75, pulse 67, temperature 97.7 F (36.5 C), temperature source Oral, resp. rate 18, height 5\' 5"  (1.651 m), weight 59 kg, unknown if currently breastfeeding. General appearance - alert, well appearing, and in no distress Chest - clear to auscultation, no wheezes, rales or rhonchi, symmetric air entry Heart - normal rate and regular rhythm Abdomen - soft, nontender, nondistended, no masses or organomegaly Pelvic - examination not indicated Extremities - peripheral pulses normal, no pedal edema, no clubbing or cyanosis  Labs: Results for orders placed or performed during the hospital encounter of 08/19/19 (from the past 336 hour(s))  POC SARS Coronavirus 2 Ag   Collection Time: 08/19/19  7:05 AM  Result Value Ref Range   SARS Coronavirus 2 Ag NEGATIVE NEGATIVE  Results for orders placed or performed during the hospital encounter of 08/18/19 (from the past 336 hour(s))  Basic metabolic panel   Collection Time: 08/18/19  8:21 AM  Result Value Ref Range   Sodium 141 135 - 145 mmol/L   Potassium 4.0 3.5 - 5.1 mmol/L   Chloride 109 98 - 111 mmol/L  CO2 27 22 - 32 mmol/L   Glucose, Bld 81 70 - 99 mg/dL   BUN 19 6 - 20 mg/dL   Creatinine, Ser 8.31 (H) 0.44 - 1.00 mg/dL   Calcium 8.9 8.9 - 51.7 mg/dL   GFR calc non Af Amer >60 >60 mL/min   GFR calc Af Amer >60 >60 mL/min   Anion gap 5 5 - 15  CBC   Collection Time: 08/18/19  8:21 AM  Result Value Ref Range   WBC 4.0 4.0 - 10.5 K/uL   RBC 4.53 3.87 - 5.11 MIL/uL   Hemoglobin 13.2 12.0 - 15.0 g/dL   HCT 61.6 07.3 - 71.0 %   MCV 89.2 80.0 - 100.0 fL   MCH 29.1 26.0 - 34.0 pg   MCHC 32.7 30.0 - 36.0 g/dL   RDW 62.6 94.8 - 54.6 %   Platelets 180 150 - 400 K/uL   nRBC 0.0 0.0 - 0.2 %  Type and screen [facility]   Collection Time: 08/18/19  8:21 AM  Result Value Ref Range   ABO/RH(D) B POS    Antibody Screen NEG    Sample Expiration 08/21/2019,2359    Extend sample reason      PREGNANT WITHIN 3 MONTHS, UNABLE TO EXTEND Performed at [facility], [address], [place], [place] [address]     Imaging Studies: No results found.  Assessment: Patient Active Problem List   Diagnosis Date Noted  . NSVD (normal spontaneous vaginal delivery) 06/23/2019  . History of depression 06/23/2019    Plan: Patient will undergo surgical management with dilation and curettage, hysteroscopy.   The risks of surgery were discussed in detail with the patient including but not limited to: bleeding which may require transfusion or reoperation; infection which may require antibiotics; injury to surrounding organs which may involve bowel, bladder, ureters ; need for additional procedures including laparoscopy or laparotomy; thromboembolic phenomenon, surgical site problems and other postoperative/anesthesia complications. Likelihood of success in alleviating the patient's condition was discussed. Routine postoperative instructions will be reviewed with the patient and her family in detail after surgery.  The patient concurred with the proposed plan, giving informed written consent for the surgery.  Patient has been NPO since last night she will remain NPO for procedure.  Anesthesia and OR aware.  Preoperative prophylactic antibiotics and SCDs ordered on call to the OR.  To OR when ready.  ----- Ranae Plumber, MD, FACOG Attending Obstetrician and Gynecologist [facility], Department of OB/GYN [facility]  08/19/2019 7:29 AM

## 2019-08-19 NOTE — OR Nursing (Signed)
Discharge pending transportation home. 

## 2019-08-19 NOTE — Transfer of Care (Signed)
Immediate Anesthesia Transfer of Care Note  Patient: Alison Banks  Procedure(s) Performed: DILATATION AND CURETTAGE /HYSTEROSCOPY (N/A )  Patient Location: PACU  Anesthesia Type:General  Level of Consciousness: awake and drowsy  Airway & Oxygen Therapy: Patient Spontanous Breathing  Post-op Assessment: Report given to RN  Post vital signs: Reviewed and stable  Last Vitals:  Vitals Value Taken Time  BP 110/79 08/19/19 0836  Temp 35.8 C 08/19/19 0836  Pulse 76 08/19/19 0836  Resp 13 08/19/19 0836  SpO2 99 % 08/19/19 0836    Last Pain:  Vitals:   08/19/19 0648  TempSrc: Oral  PainSc:          Complications: No apparent anesthesia complications

## 2019-08-19 NOTE — Op Note (Signed)
Operative Report Hysteroscopy, Dilation and Curettage 08/19/2019  Patient:  Alison Banks  34 y.o. female Preoperative diagnosis:  Prolonged postpartum bleeding, thickened endomtrium Postoperative diagnosis:  Same  PROCEDURE:  Procedure(s): DILATATION AND CURETTAGE /HYSTEROSCOPY (N/A) Surgeon:  Surgeon(s) and Role:    * Ersie Savino, Elenora Fender, MD - Primary Anesthesia:  LMA I/O: Total I/O In: - 800cc crystalloid Out: 10 [Blood:10] Specimens:  Endometrial curettings Complications: None Apparent Disposition:  VS stable to PACU  Findings: Uterus, mobile, normal size, sounding to 9 cm; normal cervix, vagina, perineum.  Hysteroscopy: floating fronds consistent with decidua/chorion and retained placenta fragment. (Teaspoon-sized portion of placenta)  Indication for procedure/Consents: 34 y.o. [id number]  here for scheduled surgery for the aforementioned diagnoses. Risks of surgery were discussed with the patient including but not limited to: bleeding which may require transfusion; infection which may require antibiotics; injury to uterus or surrounding organs; intrauterine scarring which may impair future fertility; need for additional procedures including laparotomy or laparoscopy; and other postoperative/anesthesia complications. Written informed consent was obtained.    Procedure Details:   The patient was then taken to the operating room where anesthesia was administered and was found to be adequate.  After a formal timeout was performed, she was placed in the dorsal lithotomy position and examined with the above findings. She was then prepped and draped in the sterile manner.  A speculum was then placed in the patient's vagina and a single tooth tenaculum was applied to the anterior lip of the cervix.   The uterus was sounded to 9.5cm. Her cervix was serially dilated to accommodate the myoscope, with findings as above. A sharp curettage was then performed until there was a gritty texture in all four  quadrants. The specimen was handed off to nursing.  The camera was reinserted and confirmed the uterus had been evacuated. The tenaculum was removed from the anterior lip of the cervix and the vaginal speculum was removed after noting good hemostasis. The patient tolerated the procedure well and was taken to the recovery area awake, extubated and in stable condition.  The patient will be discharged to home as per PACU criteria.  Routine postoperative instructions given. She will follow up in the clinic in two to four weeks for postoperative evaluation.  Ranae Plumber, MD [facility] OBGYN Attending Gynecologist

## 2019-08-19 NOTE — Anesthesia Postprocedure Evaluation (Signed)
Anesthesia Post Note  Patient: Alison Banks  Procedure(s) Performed: DILATATION AND CURETTAGE /HYSTEROSCOPY (N/A )  Patient location during evaluation: PACU Anesthesia Type: General Level of consciousness: awake and alert and oriented Pain management: pain level controlled Vital Signs Assessment: post-procedure vital signs reviewed and stable Respiratory status: spontaneous breathing, nonlabored ventilation and respiratory function stable Cardiovascular status: blood pressure returned to baseline and stable Postop Assessment: no signs of nausea or vomiting Anesthetic complications: no     Last Vitals:  Vitals:   08/19/19 0933 08/19/19 1004  BP: 115/76 117/71  Pulse: (!) 57 (!) 56  Resp: 16 16  Temp: (!) 36.2 C   SpO2: 100% 100%    Last Pain:  Vitals:   08/19/19 1004  TempSrc:   PainSc: 0-No pain                 Alison Banks

## 2019-08-19 NOTE — Discharge Instructions (Addendum)
You should expect to have some cramping and vaginal bleeding for about a week. This should taper off and subside, much like a period. If heavy bleeding continues or gets worse, you should contact the office for an earlier appointment.   Please call the office or physician on call for fever >101, severe pain, and heavy bleeding.   [phone]  NOTHING IN THE VAGINA FOR 2 WEEKS!!  Dr. Ward will discuss pathology results with you at your postop visit.    AMBULATORY SURGERY  DISCHARGE INSTRUCTIONS   1) The drugs that you were given will stay in your system until tomorrow so for the next 24 hours you should not:  A) Drive an automobile B) Make any legal decisions C) Drink any alcoholic beverage   2) You may resume regular meals tomorrow.  Today it is better to start with liquids and gradually work up to solid foods.  You may eat anything you prefer, but it is better to start with liquids, then soup and crackers, and gradually work up to solid foods.   3) Please notify your doctor immediately if you have any unusual bleeding, trouble breathing, redness and pain at the surgery site, drainage, fever, or pain not relieved by medication.    4) Additional Instructions:        Please contact your physician with any problems or Same Day Surgery at [phone], Monday through Friday 6 am to 4 pm, or [place] [facility] Main number at [phone].  

## 2019-08-19 NOTE — Anesthesia Preprocedure Evaluation (Signed)
Anesthesia Evaluation  Patient identified by MRN, date of birth, ID band Patient awake    Reviewed: Allergy & Precautions, NPO status , Patient's Chart, lab work & pertinent test results  History of Anesthesia Complications Negative for: history of anesthetic complications  Airway Mallampati: II  TM Distance: <3 FB Neck ROM: Full    Dental no notable dental hx.    Pulmonary neg pulmonary ROS, neg sleep apnea, neg COPD,    breath sounds clear to auscultation- rhonchi (-) wheezing      Cardiovascular Exercise Tolerance: Good (-) hypertension(-) CAD, (-) Past MI, (-) Cardiac Stents and (-) CABG  Rhythm:Regular Rate:Normal - Systolic murmurs and - Diastolic murmurs    Neuro/Psych neg Seizures PSYCHIATRIC DISORDERS Anxiety Depression negative neurological ROS     GI/Hepatic negative GI ROS, Neg liver ROS,   Endo/Other  negative endocrine ROSneg diabetes  Renal/GU negative Renal ROS     Musculoskeletal negative musculoskeletal ROS (+)   Abdominal (+) - obese,   Peds  Hematology negative hematology ROS (+)   Anesthesia Other Findings Past Medical History: No date: Anxiety No date: Complication of anesthesia No date: Depression No date: Medical history non-contributory   Reproductive/Obstetrics (+) Breast feeding                              Anesthesia Physical Anesthesia Plan  ASA: II  Anesthesia Plan: General   Post-op Pain Management:    Induction: Intravenous  PONV Risk Score and Plan: 2 and Dexamethasone, Ondansetron and Midazolam  Airway Management Planned: LMA  Additional Equipment:   Intra-op Plan:   Post-operative Plan:   Informed Consent: I have reviewed the patients History and Physical, chart, labs and discussed the procedure including the risks, benefits and alternatives for the proposed anesthesia with the patient or authorized representative who has indicated  his/her understanding and acceptance.     Dental advisory given  Plan Discussed with: CRNA and Anesthesiologist  Anesthesia Plan Comments:         Anesthesia Quick Evaluation

## 2019-08-19 NOTE — Anesthesia Procedure Notes (Signed)
Procedure Name: LMA Insertion Date/Time: 08/19/2019 7:44 AM Performed by: Rodney Booze, CRNA Pre-anesthesia Checklist: Patient identified, Emergency Drugs available, Suction available, Patient being monitored and Timeout performed Patient Re-evaluated:Patient Re-evaluated prior to induction Oxygen Delivery Method: Circle system utilized Preoxygenation: Pre-oxygenation with 100% oxygen Induction Type: IV induction Ventilation: Mask ventilation without difficulty LMA: LMA inserted LMA Size: 4.0 Number of attempts: 1

## 2019-08-22 LAB — SURGICAL PATHOLOGY

## 2019-09-20 ENCOUNTER — Ambulatory Visit: Payer: Self-pay | Admitting: Podiatry

## 2019-09-29 ENCOUNTER — Other Ambulatory Visit: Payer: Self-pay

## 2019-09-29 ENCOUNTER — Encounter: Payer: Self-pay | Admitting: Podiatry

## 2019-09-29 ENCOUNTER — Ambulatory Visit: Payer: BC Managed Care – PPO | Admitting: Podiatry

## 2019-09-29 VITALS — Temp 97.5°F

## 2019-09-29 DIAGNOSIS — Q828 Other specified congenital malformations of skin: Secondary | ICD-10-CM | POA: Diagnosis not present

## 2019-09-29 DIAGNOSIS — N852 Hypertrophy of uterus: Secondary | ICD-10-CM | POA: Insufficient documentation

## 2019-09-29 DIAGNOSIS — L989 Disorder of the skin and subcutaneous tissue, unspecified: Secondary | ICD-10-CM | POA: Diagnosis not present

## 2019-09-30 ENCOUNTER — Encounter: Payer: Self-pay | Admitting: Podiatry

## 2019-09-30 NOTE — Progress Notes (Signed)
Subjective:  Patient ID: Alison Banks, female    DOB: 07-May-1986,  MRN: [id number]  Chief Complaint  Patient presents with  . Plantar Warts    Patient presents today for plantar wart bottom of right forefoot and possible on left foot.  she has had hx of plantar warts and now feels like she is walking on a rock when barefoot.    34 y.o. female presents with the above complaint.  Patient presents with right submetatarsal 2 benign skin lesion that has been causing her a lot of pain especially now when she is ambulating.  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  She states that this may be coming back again.  She has not tried anything.  She states her husband has warts on the bottom of the foot that is likely the cause of spreading them.  She denies any other acute of complaints.  She has not tried any treatment options.  She would like to discuss future treatment options.   Review of Systems: Negative except as noted in the HPI. Denies N/V/F/Ch.  Past Medical History:  Diagnosis Date  . Anxiety   . Complication of anesthesia   . Depression   . Medical history non-contributory     Current Outpatient Medications:  .  busPIRone (BUSPAR) 5 MG tablet, Take 5 mg by mouth 2 (two) times daily., Disp: , Rfl:  .  Prenatal Vit-Fe Fumarate-FA (MULTIVITAMIN-PRENATAL) 27-0.8 MG TABS tablet, Take 1 tablet by mouth daily at 12 noon., Disp: , Rfl:   Social History   Tobacco Use  Smoking Status Never Smoker  Smokeless Tobacco Never Used    Allergies  Allergen Reactions  . Penicillins Itching    Did it involve swelling of the face/tongue/throat, SOB, or low BP? No Did it involve sudden or severe rash/hives, skin peeling, or any reaction on the inside of your mouth or nose? No Did you need to seek medical attention at a hospital or doctor's  office? Yes (was in labor at the time) When did it last happen? If all above answers are "NO", may proceed with cephalosporin use.    Objective:   Vitals:   09/29/19 1547  Temp: (!) 97.5 F (36.4 C)   There is no height or weight on file to calculate BMI. Constitutional Well developed. Well nourished.  Vascular Dorsalis pedis pulses palpable bilaterally. Posterior tibial pulses palpable bilaterally. Capillary refill normal to all digits.  No cyanosis or clubbing noted. Pedal hair growth normal.  Neurologic Normal speech. Oriented to person, place, and time. Epicritic sensation to light touch grossly present bilaterally.  Dermatologic  hyperkeratotic benign skin lesion noted right submetatarsal 2 with a central nucleated core.  No pinpoint bleeding noted upon debridement of the lesion.  Orthopedic: Normal joint ROM without pain or crepitus bilaterally. No visible deformities. No bony tenderness.   Radiographs: None Assessment:  No diagnosis found. Plan:  Patient was evaluated and treated and all questions answered.  Benign skin lesion/porokeratosis -I explained to the patient the etiology of porokeratosis and various treatment options were extensively discussed.  I believe she will benefit from destruction of Cantharone chemical porokeratosis after excision of the nucleated central core.  Patient agrees with the plan would like to proceed with the excision of the nucleated core followed by application of  Cantharone therapy to help destroy the skin lesion. -Endocrine therapy was applied in standard technique.  I will see her back again in 2 weeks for assessment.  No follow-ups on file.

## 2019-10-10 ENCOUNTER — Encounter: Payer: Self-pay | Admitting: Podiatry

## 2019-10-13 ENCOUNTER — Ambulatory Visit: Payer: BC Managed Care – PPO | Admitting: Podiatry

## 2019-11-08 ENCOUNTER — Ambulatory Visit: Payer: BC Managed Care – PPO | Admitting: Podiatry

## 2020-02-06 LAB — RPR: RPR Ser Ql: NONREACTIVE — AB

## 2022-07-04 ENCOUNTER — Other Ambulatory Visit: Payer: Self-pay | Admitting: Obstetrics and Gynecology

## 2022-07-04 DIAGNOSIS — M7989 Other specified soft tissue disorders: Secondary | ICD-10-CM

## 2022-07-28 ENCOUNTER — Other Ambulatory Visit: Payer: Self-pay | Admitting: Obstetrics and Gynecology

## 2022-07-28 ENCOUNTER — Ambulatory Visit
Admission: RE | Admit: 2022-07-28 | Discharge: 2022-07-28 | Disposition: A | Discharge status: Home / Self Care | Payer: BC Managed Care – PPO | Source: Ambulatory Visit | Attending: Obstetrics and Gynecology | Admitting: Obstetrics and Gynecology

## 2022-07-28 DIAGNOSIS — M7989 Other specified soft tissue disorders: Secondary | ICD-10-CM
